# Patient Record
Sex: Female | Born: 1970 | Race: White | Hispanic: No | Marital: Married | Smoking: Former smoker
Health system: Southern US, Community
[De-identification: ages and names within clinical notes are randomized; demographics above are authoritative.]

## PROBLEM LIST (undated history)

## (undated) DIAGNOSIS — D649 Anemia, unspecified: Secondary | ICD-10-CM

## (undated) DIAGNOSIS — F419 Anxiety disorder, unspecified: Secondary | ICD-10-CM

## (undated) DIAGNOSIS — Z8719 Personal history of other diseases of the digestive system: Secondary | ICD-10-CM

## (undated) DIAGNOSIS — I4892 Unspecified atrial flutter: Secondary | ICD-10-CM

## (undated) DIAGNOSIS — I4891 Unspecified atrial fibrillation: Secondary | ICD-10-CM

## (undated) DIAGNOSIS — I10 Essential (primary) hypertension: Secondary | ICD-10-CM

## (undated) DIAGNOSIS — G4733 Obstructive sleep apnea (adult) (pediatric): Secondary | ICD-10-CM

## (undated) DIAGNOSIS — E162 Hypoglycemia, unspecified: Secondary | ICD-10-CM

## (undated) HISTORY — PX: OTHER SURGICAL HISTORY: SHX169

## (undated) HISTORY — PX: APPENDECTOMY: SHX54

## (undated) HISTORY — DX: Unspecified atrial fibrillation: I48.91

## (undated) HISTORY — DX: Essential (primary) hypertension: I10

## (undated) HISTORY — DX: Obstructive sleep apnea (adult) (pediatric): G47.33

## (undated) HISTORY — DX: Anxiety disorder, unspecified: F41.9

---

## 2005-09-10 ENCOUNTER — Inpatient Hospital Stay: Payer: Self-pay | Admitting: Internal Medicine

## 2005-09-10 ENCOUNTER — Other Ambulatory Visit: Payer: Self-pay

## 2005-09-11 ENCOUNTER — Other Ambulatory Visit: Payer: Self-pay

## 2005-10-22 ENCOUNTER — Other Ambulatory Visit: Payer: Self-pay

## 2005-10-22 ENCOUNTER — Inpatient Hospital Stay: Payer: Self-pay | Admitting: Internal Medicine

## 2005-11-14 ENCOUNTER — Other Ambulatory Visit: Payer: Self-pay

## 2005-11-14 ENCOUNTER — Emergency Department: Payer: Self-pay | Admitting: Emergency Medicine

## 2006-05-08 ENCOUNTER — Other Ambulatory Visit: Payer: Self-pay

## 2006-05-08 ENCOUNTER — Inpatient Hospital Stay: Payer: Self-pay | Admitting: *Deleted

## 2006-05-09 ENCOUNTER — Other Ambulatory Visit: Payer: Self-pay

## 2006-05-17 ENCOUNTER — Other Ambulatory Visit: Payer: Self-pay

## 2006-05-17 ENCOUNTER — Emergency Department: Payer: Self-pay | Admitting: Emergency Medicine

## 2006-08-25 ENCOUNTER — Emergency Department: Payer: Self-pay | Admitting: Emergency Medicine

## 2006-08-25 ENCOUNTER — Other Ambulatory Visit: Payer: Self-pay

## 2006-10-18 ENCOUNTER — Emergency Department: Payer: Self-pay | Admitting: Emergency Medicine

## 2006-10-18 ENCOUNTER — Other Ambulatory Visit: Payer: Self-pay

## 2006-10-29 ENCOUNTER — Emergency Department: Payer: Self-pay | Admitting: Emergency Medicine

## 2006-10-29 ENCOUNTER — Other Ambulatory Visit: Payer: Self-pay

## 2006-11-25 ENCOUNTER — Emergency Department: Payer: Self-pay | Admitting: Emergency Medicine

## 2006-11-25 ENCOUNTER — Other Ambulatory Visit: Payer: Self-pay

## 2008-01-11 ENCOUNTER — Emergency Department: Payer: Self-pay | Admitting: Emergency Medicine

## 2008-04-24 ENCOUNTER — Emergency Department: Payer: Self-pay | Admitting: Internal Medicine

## 2008-11-17 ENCOUNTER — Emergency Department: Payer: Self-pay | Admitting: Emergency Medicine

## 2008-11-28 ENCOUNTER — Emergency Department: Payer: Self-pay | Admitting: Emergency Medicine

## 2009-05-16 ENCOUNTER — Emergency Department: Payer: Self-pay | Admitting: Unknown Physician Specialty

## 2010-01-22 ENCOUNTER — Emergency Department: Payer: Self-pay | Admitting: Emergency Medicine

## 2010-05-06 ENCOUNTER — Emergency Department: Payer: Self-pay | Admitting: Emergency Medicine

## 2011-07-04 ENCOUNTER — Emergency Department: Payer: Self-pay | Admitting: Emergency Medicine

## 2011-07-05 ENCOUNTER — Emergency Department: Payer: Self-pay | Admitting: Emergency Medicine

## 2011-07-05 LAB — BASIC METABOLIC PANEL
BUN: 9 mg/dL (ref 7–18)
Calcium, Total: 8.6 mg/dL (ref 8.5–10.1)
Chloride: 104 mmol/L (ref 98–107)
EGFR (African American): >60
EGFR (Non-African Amer.): >60
Glucose: 115 mg/dL — ABNORMAL HIGH (ref 65–99)
Osmolality: 275 (ref 275–301)
Potassium: 3.9 mmol/L (ref 3.5–5.1)

## 2011-07-05 LAB — CBC
HGB: 10.9 g/dL — ABNORMAL LOW (ref 12.0–16.0)
MCH: 24.4 pg — ABNORMAL LOW (ref 26.0–34.0)
MCHC: 30.8 g/dL — ABNORMAL LOW (ref 32.0–36.0)
WBC: 11.1 10*3/uL — ABNORMAL HIGH (ref 3.6–11.0)

## 2011-10-17 ENCOUNTER — Emergency Department: Payer: Self-pay | Admitting: Emergency Medicine

## 2012-01-27 ENCOUNTER — Emergency Department: Payer: Self-pay | Admitting: Emergency Medicine

## 2012-01-27 LAB — RAPID INFLUENZA A&B ANTIGENS

## 2012-07-24 ENCOUNTER — Emergency Department: Payer: Self-pay | Admitting: Emergency Medicine

## 2012-07-26 LAB — BETA STREP CULTURE(ARMC)

## 2013-12-20 ENCOUNTER — Emergency Department: Payer: Self-pay | Admitting: Emergency Medicine

## 2013-12-20 LAB — BASIC METABOLIC PANEL
ANION GAP: 6 — AB (ref 7–16)
BUN: 8 mg/dL (ref 7–18)
Calcium, Total: 8.4 mg/dL — ABNORMAL LOW (ref 8.5–10.1)
Chloride: 109 mmol/L — ABNORMAL HIGH (ref 98–107)
Co2: 26 mmol/L (ref 21–32)
Creatinine: 0.63 mg/dL (ref 0.60–1.30)
EGFR (Non-African Amer.): >60
GLUCOSE: 106 mg/dL — AB (ref 65–99)
Osmolality: 280 (ref 275–301)
Potassium: 3.8 mmol/L (ref 3.5–5.1)
SODIUM: 141 mmol/L (ref 136–145)

## 2013-12-20 LAB — CBC
HCT: 38.1 % (ref 35.0–47.0)
HGB: 12.1 g/dL (ref 12.0–16.0)
MCH: 25.4 pg — AB (ref 26.0–34.0)
MCHC: 31.8 g/dL — ABNORMAL LOW (ref 32.0–36.0)
MCV: 80 fL (ref 80–100)
PLATELETS: 374 10*3/uL (ref 150–440)
RBC: 4.78 10*6/uL (ref 3.80–5.20)
RDW: 15.9 % — ABNORMAL HIGH (ref 11.5–14.5)
WBC: 6.5 10*3/uL (ref 3.6–11.0)

## 2013-12-20 LAB — PRO B NATRIURETIC PEPTIDE: B-Type Natriuretic Peptide: 44 pg/mL (ref 0–125)

## 2013-12-20 LAB — TROPONIN I: Troponin-I: <0.02 ng/mL

## 2014-05-02 ENCOUNTER — Emergency Department
Admit: 2014-05-02 | Disposition: A | Discharge status: Home / Self Care | Payer: Self-pay | Admitting: Emergency Medicine

## 2014-05-02 LAB — CBC
HCT: 39.4 % (ref 35.0–47.0)
HGB: 13 g/dL (ref 12.0–16.0)
MCH: 26.3 pg (ref 26.0–34.0)
MCHC: 33 g/dL (ref 32.0–36.0)
MCV: 80 fL (ref 80–100)
PLATELETS: 345 10*3/uL (ref 150–440)
RBC: 4.94 10*6/uL (ref 3.80–5.20)
RDW: 16.2 % — ABNORMAL HIGH (ref 11.5–14.5)
WBC: 9.6 10*3/uL (ref 3.6–11.0)

## 2014-05-02 LAB — URINALYSIS, COMPLETE
BILIRUBIN, UR: NEGATIVE
Blood: NEGATIVE
Glucose,UR: NEGATIVE mg/dL (ref 0–75)
Hyaline Cast: 2
Leukocyte Esterase: NEGATIVE
Nitrite: NEGATIVE
PH: 5 (ref 4.5–8.0)
Protein: NEGATIVE
RBC,UR: NONE SEEN /HPF (ref 0–5)
Specific Gravity: 1.024 (ref 1.003–1.030)
Squamous Epithelial: 5

## 2014-05-02 LAB — COMPREHENSIVE METABOLIC PANEL
ALK PHOS: 67 U/L
ANION GAP: 7 (ref 7–16)
Albumin: 4.6 g/dL
BUN: 10 mg/dL
Bilirubin,Total: 0.8 mg/dL
CALCIUM: 9.3 mg/dL
CHLORIDE: 107 mmol/L
CREATININE: 0.57 mg/dL
Co2: 24 mmol/L
EGFR (African American): >60
EGFR (Non-African Amer.): >60
GLUCOSE: 110 mg/dL — AB
POTASSIUM: 3.8 mmol/L
SGOT(AST): 18 U/L
SGPT (ALT): 19 U/L
Sodium: 138 mmol/L
Total Protein: 8.2 g/dL — ABNORMAL HIGH

## 2014-05-02 LAB — LIPASE, BLOOD: Lipase: 54 U/L — ABNORMAL HIGH

## 2014-06-12 ENCOUNTER — Emergency Department
Admission: EM | Admit: 2014-06-12 | Discharge: 2014-06-12 | Disposition: A | Discharge status: Home / Self Care | Payer: Self-pay | Attending: Student | Admitting: Student

## 2014-06-12 ENCOUNTER — Encounter: Payer: Self-pay | Admitting: Emergency Medicine

## 2014-06-12 DIAGNOSIS — Z720 Tobacco use: Secondary | ICD-10-CM | POA: Insufficient documentation

## 2014-06-12 DIAGNOSIS — J0100 Acute maxillary sinusitis, unspecified: Secondary | ICD-10-CM | POA: Insufficient documentation

## 2014-06-12 MED ORDER — AMOXICILLIN-POT CLAVULANATE 875-125 MG PO TABS: 1.0000 | ORAL_TABLET | Freq: Two times a day (BID) | ORAL | Status: AC

## 2014-06-12 NOTE — ED Notes (Signed)
States cough is productive   Greenish/yellow phlegm

## 2014-06-12 NOTE — ED Notes (Signed)
Sore throat about 2-3 days ago  Now having body aches cpngestion  cough

## 2014-06-12 NOTE — ED Notes (Signed)
C/o sorethroat, cold symptoms, prod cough greenish past few days, back of throat slightly red, lungs clear

## 2014-06-12 NOTE — ED Provider Notes (Signed)
[facility] Emergency Department Provider Note  ____________________________________________  Time seen: Approximately 11:30 AM  I have reviewed the triage vital signs and the nursing notes.   HISTORY  Chief Complaint Influenza   HPI Hassie BruceJoanne Carrol is a 44 y.o. female presents to the ER with complaints of congestion, green nasal discharge, sinus pressure, sore throat and cough. Patient states symptoms have been present times approximately 6 days. It worsened over 2-3 days. Denies chest pain, palpitations, or shortness of breath. Intermittent chills unknown if fever. Reports continues to eat and drink well. Denies nausea, vomiting, abdominal pain.   Medical History: Diverticulitis Atrial fibrillation with cardiac ablation Anemia Hypoglycemia Left ovarian cyst  There are no active problems to display for this patient.   History reviewed. No pertinent past surgical history.  No current outpatient prescriptions on file.  Allergies Barbiturates; Codeine; and Ibuprofen  No family history on file.  Social History History  Substance Use Topics  . Smoking status: Current Every Day Smoker  . Smokeless tobacco: Not on file  . Alcohol Use: No    Review of Systems Constitutional: Positive for chills Eyes: No visual changes. ENT: Congestion sore throat as above Cardiovascular: Denies chest pain. Respiratory: Cough as above. Denies shortness of breath. Gastrointestinal: No abdominal pain.  No nausea, no vomiting.  No diarrhea.  No constipation. Genitourinary: Negative for dysuria. Musculoskeletal: Negative for back pain. Skin: Negative for rash. Neurological: Negative for headaches, focal weakness or numbness.  10-point ROS otherwise negative.  ____________________________________________   PHYSICAL EXAM:  VITAL SIGNS: ED Triage Vitals  Enc Vitals Group     BP 06/12/14 0919 125/96 mmHg     Pulse Rate 06/12/14 0919 87     Resp 06/12/14 0919 20      Temp 06/12/14 0919 98.4 F (36.9 C)     Temp Source 06/12/14 0919 Oral     SpO2 06/12/14 0919 100 %     Weight 06/12/14 0919 203 lb (92.08 kg)     Height 06/12/14 0919 5\' 9"  (1.753 m)     Head Cir --      Peak Flow --      Pain Score 06/12/14 0922 5     Pain Loc --      Pain Edu? --      Excl. in GC? --     Constitutional: Alert and oriented. Well appearing and in no acute distress. Eyes: Conjunctivae are normal. PERRL. EOMI. Head: Atraumatic. Mild frontal sinus tenderness to palpation. Moderate maxillary sinus tenderness to palpation. No swelling, erythema, or induration. Nose: Purulent green nasal congestion with nasal mucosal inflammation. Nares patent. Mouth/Throat: Mucous membranes are moist.  Mild pharyngeal erythema. No tonsillar swelling, exudate. Neck: No stridor. No cervical spine tenderness to palpation. Hematological/Lymphatic/Immunilogical: No cervical lymphadenopathy. Cardiovascular: Normal rate, regular rhythm. Grossly normal heart sounds.  Good peripheral circulation. Respiratory: Normal respiratory effort.  No retractions. Lungs CTAB. Gastrointestinal: Soft and nontender. No distention. No abdominal bruits. No CVA tenderness. Musculoskeletal: No lower extremity tenderness nor edema.  No joint effusions. Neurologic:  Normal speech and language. No gross focal neurologic deficits are appreciated. Speech is normal. No gait instability. Skin:  Skin is warm, dry and intact. No rash noted. Psychiatric: Mood and affect are normal. Speech and behavior are normal.  ____________________________________________ _________________________________________   INITIAL IMPRESSION / ASSESSMENT AND PLAN / ED COURSE  Pertinent labs & imaging results that were available during my care of the patient were reviewed by me and considered in my  medical decision making (see chart for details).  Well-appearing. No acute distress. Continues to tolerate by mouth fluids at home. Presents  to the ER with complaints of sinus pain and congestion.Lungs clear throughout. Suspect bacterial sinusitis. Will treat with oral Augmentin. Tylenol or ibuprofen over-the-counter as needed. discussed the importance of supportive treatment including fluids and rest. Follow with primary care physician in the next 2-3 days. Discussed follow-up and return parameters. patient return to ER for any worsening concerns. Patient agreed to plan. ____________________________________________   FINAL CLINICAL IMPRESSION(S) / ED DIAGNOSES  Final diagnoses:  Acute maxillary sinusitis, recurrence not specified      Renford DillsLindsey Yoseline Andersson, NP 06/12/14 1204  Gayla DossEryka A Gayle, MD 06/12/14 1455

## 2014-06-12 NOTE — Discharge Instructions (Signed)
Take medications as described. Rest. Drink plenty of water. Take over the counter Tylenol or Ibuprofen As Needed. Follow-Up Primary Care Physician in the Next 2-3 Days. Or Follow up with the above As Needed. Return to the ER for new or worsening concerns.  Sinusitis Sinusitis is redness, soreness, and puffiness (inflammation) of the air pockets in the bones of your face (sinuses). The redness, soreness, and puffiness can cause air and mucus to get trapped in your sinuses. This can allow germs to grow and cause an infection.  HOME CARE   Drink enough fluids to keep your pee (urine) clear or pale yellow.  Use a humidifier in your home.  Run a hot shower to create steam in the bathroom. Sit in the bathroom with the door closed. Breathe in the steam 3-4 times a day.  Put a warm, moist washcloth on your face 3-4 times a day, or as told by your doctor.  Use salt water sprays (saline sprays) to wet the thick fluid in your nose. This can help the sinuses drain.  Only take medicine as told by your doctor. GET HELP RIGHT AWAY IF:   Your pain gets worse.  You have very bad headaches.  You are sick to your stomach (nauseous).  You throw up (vomit).  You are very sleepy (drowsy) all the time.  Your face is puffy (swollen).  Your vision changes.  You have a stiff neck.  You have trouble breathing. MAKE SURE YOU:   Understand these instructions.  Will watch your condition.  Will get help right away if you are not doing well or get worse. Document Released: 07/06/2007 Document Revised: 10/12/2011 Document Reviewed: 08/23/2011 [facility] Patient Information 2015 [place]. This information is not intended to replace advice given to you by your health care provider. Make sure you discuss any questions you have with your health care provider.

## 2014-06-28 ENCOUNTER — Emergency Department
Admission: EM | Admit: 2014-06-28 | Discharge: 2014-06-29 | Disposition: A | Discharge status: Home / Self Care | Payer: Self-pay | Attending: Emergency Medicine | Admitting: Emergency Medicine

## 2014-06-28 ENCOUNTER — Emergency Department: Payer: Self-pay

## 2014-06-28 ENCOUNTER — Encounter: Payer: Self-pay | Admitting: Emergency Medicine

## 2014-06-28 DIAGNOSIS — N920 Excessive and frequent menstruation with regular cycle: Secondary | ICD-10-CM | POA: Insufficient documentation

## 2014-06-28 DIAGNOSIS — Z87891 Personal history of nicotine dependence: Secondary | ICD-10-CM | POA: Insufficient documentation

## 2014-06-28 DIAGNOSIS — Z3202 Encounter for pregnancy test, result negative: Secondary | ICD-10-CM | POA: Insufficient documentation

## 2014-06-28 DIAGNOSIS — Z79899 Other long term (current) drug therapy: Secondary | ICD-10-CM | POA: Insufficient documentation

## 2014-06-28 DIAGNOSIS — R42 Dizziness and giddiness: Secondary | ICD-10-CM | POA: Insufficient documentation

## 2014-06-28 HISTORY — DX: Personal history of other diseases of the digestive system: Z87.19

## 2014-06-28 HISTORY — DX: Anemia, unspecified: D64.9

## 2014-06-28 HISTORY — DX: Unspecified atrial flutter: I48.92

## 2014-06-28 HISTORY — DX: Hypoglycemia, unspecified: E16.2

## 2014-06-28 LAB — URINALYSIS COMPLETE WITH MICROSCOPIC (ARMC ONLY)
BILIRUBIN URINE: NEGATIVE
Bacteria, UA: NONE SEEN
GLUCOSE, UA: NEGATIVE mg/dL
Ketones, ur: NEGATIVE mg/dL
Nitrite: NEGATIVE
PROTEIN: NEGATIVE mg/dL
Specific Gravity, Urine: 1.006 (ref 1.005–1.030)
pH: 9 — ABNORMAL HIGH (ref 5.0–8.0)

## 2014-06-28 LAB — HCG, QUANTITATIVE, PREGNANCY

## 2014-06-28 LAB — CBC
HCT: 37.9 % (ref 35.0–47.0)
Hemoglobin: 12.6 g/dL (ref 12.0–16.0)
MCH: 27.1 pg (ref 26.0–34.0)
MCHC: 33.4 g/dL (ref 32.0–36.0)
MCV: 81.2 fL (ref 80.0–100.0)
PLATELETS: 334 10*3/uL (ref 150–440)
RBC: 4.66 MIL/uL (ref 3.80–5.20)
RDW: 16 % — ABNORMAL HIGH (ref 11.5–14.5)
WBC: 7.1 10*3/uL (ref 3.6–11.0)

## 2014-06-28 LAB — POCT PREGNANCY, URINE: PREG TEST UR: NEGATIVE

## 2014-06-28 MED ORDER — SODIUM CHLORIDE 0.9 % IV BOLUS (SEPSIS)
1000.0000 mL | INTRAVENOUS | Status: AC
  Administered 2014-06-28: 1000 mL via INTRAVENOUS

## 2014-06-28 NOTE — Discharge Instructions (Signed)
As we discussed, your workup was reassuring today, with an essentially normal physical exam, a normal blood count, and reassuring ultrasound.  Please follow-up with your doctor as planned, or call Dr. Tiburcio PeaHarris or one of his partners at the number included to follow-up with a local OB/GYN doctor.  If your symptoms get worse, or if you develop new symptoms that concern you, please return immediately to the emergency department   Menorrhagia Menorrhagia is the medical term for when your menstrual periods are heavy or last longer than usual. With menorrhagia, every period you have may cause enough blood loss and cramping that you are unable to maintain your usual activities. CAUSES  In some cases, the cause of heavy periods is unknown, but a number of conditions may cause menorrhagia. Common causes include:  A problem with the hormone-producing thyroid gland (hypothyroid).  Noncancerous growths in the uterus (polyps or fibroids).  An imbalance of the estrogen and progesterone hormones.  One of your ovaries not releasing an egg during one or more months.  Side effects of having an intrauterine device (IUD).  Side effects of some medicines, such as anti-inflammatory medicines or blood thinners.  A bleeding disorder that stops your blood from clotting normally. SIGNS AND SYMPTOMS  During a normal period, bleeding lasts between 4 and 8 days. Signs that your periods are too heavy include:  You routinely have to change your pad or tampon every 1 or 2 hours because it is completely soaked.  You pass blood clots larger than 1 inch (2.5 cm) in size.  You have bleeding for more than 7 days.  You need to use pads and tampons at the same time because of heavy bleeding.  You need to wake up to change your pads or tampons during the night.  You have symptoms of anemia, such as tiredness, fatigue, or shortness of breath. DIAGNOSIS  Your health care provider will perform a physical exam and ask you  questions about your symptoms and menstrual history. Other tests may be ordered based on what the health care provider finds during the exam. These tests can include:  Blood tests. Blood tests are used to check if you are pregnant or have hormonal changes, a bleeding or thyroid disorder, low iron levels (anemia), or other problems.  Endometrial biopsy. Your health care provider takes a sample of tissue from the inside of your uterus to be examined under a microscope.  Pelvic ultrasound. This test uses sound waves to make a picture of your uterus, ovaries, and vagina. The pictures can show if you have fibroids or other growths.  Hysteroscopy. For this test, your health care provider will use a small telescope to look inside your uterus. Based on the results of your initial tests, your health care provider may recommend further testing. TREATMENT  Treatment may not be needed. If it is needed, your health care provider may recommend treatment with one or more medicines first. If these do not reduce bleeding enough, a surgical treatment might be an option. The best treatment for you will depend on:   Whether you need to prevent pregnancy.  Your desire to have children in the future.  The cause and severity of your bleeding.  Your opinion and personal preference.  Medicines for menorrhagia may include:  Birth control methods that use hormones. These include the pill, skin patch, vaginal ring, shots that you get every 3 months, hormonal IUD, and implant. These treatments reduce bleeding during your menstrual period.  Medicines that thicken  blood and slow bleeding.  Medicines that reduce swelling, such as ibuprofen.  Medicines that contain a synthetic hormone called progestin.   Medicines that make the ovaries stop working for a short time.  You may need surgical treatment for menorrhagia if the medicines are unsuccessful. Treatment options include:  Dilation and curettage (D&C). In  this procedure, your health care provider opens (dilates) your cervix and then scrapes or suctions tissue from the lining of your uterus to reduce menstrual bleeding.  Operative hysteroscopy. This procedure uses a tiny tube with a light (hysteroscope) to view your uterine cavity and can help in the surgical removal of a polyp that may be causing heavy periods.  Endometrial ablation. Through various techniques, your health care provider permanently destroys the entire lining of your uterus (endometrium). After endometrial ablation, most women have little or no menstrual flow. Endometrial ablation reduces your ability to become pregnant.  Endometrial resection. This surgical procedure uses an electrosurgical wire loop to remove the lining of the uterus. This procedure also reduces your ability to become pregnant.  Hysterectomy. Surgical removal of the uterus and cervix is a permanent procedure that stops menstrual periods. Pregnancy is not possible after a hysterectomy. This procedure requires anesthesia and hospitalization. HOME CARE INSTRUCTIONS   Only take over-the-counter or prescription medicines as directed by your health care provider. Take prescribed medicines exactly as directed. Do not change or switch medicines without consulting your health care provider.  Take any prescribed iron pills exactly as directed by your health care provider. Long-term heavy bleeding may result in low iron levels. Iron pills help replace the iron your body lost from heavy bleeding. Iron may cause constipation. If this becomes a problem, increase the bran, fruits, and roughage in your diet.  Do not take aspirin or medicines that contain aspirin 1 week before or during your menstrual period. Aspirin may make the bleeding worse.  If you need to change your sanitary pad or tampon more than once every 2 hours, stay in bed and rest as much as possible until the bleeding stops.  Eat well-balanced meals. Eat foods high  in iron. Examples are leafy green vegetables, meat, liver, eggs, and whole grain breads and cereals. Do not try to lose weight until the abnormal bleeding has stopped and your blood iron level is back to normal. SEEK MEDICAL CARE IF:   You soak through a pad or tampon every 1 or 2 hours, and this happens every time you have a period.  You need to use pads and tampons at the same time because you are bleeding so much.  You need to change your pad or tampon during the night.  You have a period that lasts for more than 8 days.  You pass clots bigger than 1 inch wide.  You have irregular periods that happen more or less often than once a month.  You feel dizzy or faint.  You feel very weak or tired.  You feel short of breath or feel your heart is beating too fast when you exercise.  You have nausea and vomiting or diarrhea while you are taking your medicine.  You have any problems that may be related to the medicine you are taking. SEEK IMMEDIATE MEDICAL CARE IF:   You soak through 4 or more pads or tampons in 2 hours.  You have any bleeding while you are pregnant. MAKE SURE YOU:   Understand these instructions.  Will watch your condition.  Will get help right away if  you are not doing well or get worse. Document Released: 01/17/2005 Document Revised: 01/22/2013 Document Reviewed: 07/08/2012 [facility] Patient Information 2015 [place], [place]. This information is not intended to replace advice given to you by your health care provider. Make sure you discuss any questions you have with your health care provider.

## 2014-06-28 NOTE — ED Provider Notes (Addendum)
[facility] Emergency Department Provider Note  ____________________________________________  Time seen: Approximately 6:06 PM  I have reviewed the triage vital signs and the nursing notes.   HISTORY  Chief Complaint Vaginal Bleeding    HPI Amanda Holt is a 44 y.o. female with no significant past medical history who presents with vaginal bleeding that she describes as severe.  She started her regular period about 2-3 days ago but states that she is typically very regular with predictable amount of bleeding.  However, just prior to arrival she states that she passed a very large blood clot and had blood "gushing" from her vagina.  She was concerned about the amount of blood lost and was feeling lightheaded and dizzy.  She called EMS and was brought to the emergency department.  She also describes generalized lower abdominal cramping which is been severe at times but which waxes and wanes and is currently mild.  No nausea or vomiting.  She does not believe she is pregnant.  She has not had this problem in the past.  She states that normally she goes through about 4 pads a day during her period, but she has gone through "a lot more" today.   Past Medical History  Diagnosis Date  . Atrial flutter   . Anemia   . Hypoglycemia   . Hx of diverticulitis of colon     There are no active problems to display for this patient.   Past Surgical History  Procedure Laterality Date  . Eblation    . Appendectomy    . Cesarian section      Current Outpatient Rx  Name  Route  Sig  Dispense  Refill  . ECHINACEA PO   Oral   Take 1 capsule by mouth daily as needed (for cold symptoms.).         Marland Kitchen Probiotic Product (PROBIOTIC ACIDOPHILUS) CAPS   Oral   Take 1 capsule by mouth daily.         . vitamin C (ASCORBIC ACID) 500 MG tablet   Oral   Take 500 mg by mouth daily.           Allergies Barbiturates; Codeine; and Ibuprofen  History reviewed. No pertinent  family history.  Social History History  Substance Use Topics  . Smoking status: Former Games developer  . Smokeless tobacco: Not on file  . Alcohol Use: No    Review of Systems Constitutional: No fever/chills Eyes: No visual changes. ENT: No sore throat. Cardiovascular: Denies chest pain. Respiratory: Denies shortness of breath. Gastrointestinal: Cramping lower abdominal pain.  No nausea, no vomiting.  No diarrhea.  No constipation. Genitourinary: Negative for dysuria.  Heavy vaginal bleeding Musculoskeletal: Negative for back pain. Skin: Negative for rash. Neurological: Negative for headaches, focal weakness or numbness.  10-point ROS otherwise negative.  ____________________________________________   PHYSICAL EXAM:  VITAL SIGNS: 125/96, 87, 20, 98.4 degrees, 100% spO2 on RA   Constitutional: Alert and oriented. Anxious but otherwise well appearing and in no acute distress. Eyes: Conjunctivae are normal. PERRL. EOMI. Head: Atraumatic. Nose: No congestion/rhinnorhea. Mouth/Throat: Mucous membranes are moist.  Oropharynx non-erythematous. Neck: No stridor.   Cardiovascular: Normal rate, regular rhythm. Grossly normal heart sounds.  Good peripheral circulation. Respiratory: Normal respiratory effort.  No retractions. Lungs CTAB. Gastrointestinal: Soft with mild generalized tenderness but no guarding or rebound. No distention. No abdominal bruits. No CVA tenderness. Genitourinary: 2 thumb-sized blood clots were present in the vaginal vault which were removed.  The cervical os  is closed with no active bleeding.  She was tender upon the exam but states this is normal for her.  No other abnormalities detected. Musculoskeletal: No lower extremity tenderness nor edema.  No joint effusions. Neurologic:  Normal speech and language. No gross focal neurologic deficits are appreciated. Speech is normal. No gait instability. Skin:  Skin is warm, dry and intact. No rash noted. Psychiatric:  Mood and affect are normal. Speech and behavior are normal.  ____________________________________________   LABS (all labs ordered are listed, but only abnormal results are displayed)  Labs Reviewed  CBC - Abnormal; Notable for the following:    RDW 16.0 (*)    All other components within normal limits  URINALYSIS COMPLETEWITH MICROSCOPIC ([facility] ONLY) - Abnormal; Notable for the following:    Color, Urine STRAW (*)    APPearance CLEAR (*)    Hgb urine dipstick 3+ (*)    pH 9.0 (*)    Leukocytes, UA TRACE (*)    Squamous Epithelial / LPF 0-5 (*)    All other components within normal limits  HCG, QUANTITATIVE, PREGNANCY  POCT PREGNANCY, URINE   ____________________________________________  EKG  Not indicated ____________________________________________  RADIOLOGY  Pending ultrasounds  ____________________________________________   PROCEDURES  Procedure(s) performed: None  Critical Care performed: No ____________________________________________   INITIAL IMPRESSION / ASSESSMENT AND PLAN / ED COURSE  Pertinent labs & imaging results that were available during my care of the patient were reviewed by me and considered in my medical decision making (see chart for details).  Suspect menorrhagia, but we will proceed with typical workup including baseline CBC, , pregnancy test (though patient has had a tubal ligation).  I will give 1 L of normal saline given her symptoms of lightheadedness and dizziness.    ----------------------------------------- 9:43 PM on 06/28/2014 -----------------------------------------   The patient's hemoglobin is normal.  We are awaiting ultrasound results. Dr. Pershing ProudSchaevitz will follow up ultrasounds, but anticipate discharge with outpatient follow up ([facility] artery [facility]   ____________________________________________  FINAL CLINICAL IMPRESSION(S) / ED DIAGNOSES  Final diagnoses:  Menorrhagia       NEW MEDICATIONS STARTED DURING THIS VISIT:  New Prescriptions   No medications on file     Loleta Roseory Rickie Gutierres, MD 06/28/14 2217

## 2014-06-28 NOTE — ED Notes (Signed)
Patient transported to Ultrasound 

## 2014-06-28 NOTE — ED Notes (Signed)
Pt up to commode for urine sample. Pt with significant other at bedside. Pt states "i'm cramping just a little bit, but i'm ok".

## 2014-06-28 NOTE — ED Notes (Signed)
Pt returned from us. Pt up to restroom to void. Scant bright red bleeding noted to pad beneath pt's buttocks. Pad has not been changed since 1900. Pt with pwd skin, vss. Call bell on side of bed.

## 2014-06-29 NOTE — ED Provider Notes (Signed)
This patient was seen by my colleague Dr. York CeriseForbach for vaginal bleeding and menorrhagia. An ultrasound of her pelvis was performed. This ultrasound showed no acute findings. Please see Dr. Scharlene CornForbach's note for additional information. At this hour, 12:15, the patient appears well and stable and ready for discharge. We'll release her home for follow-up with GYN.  Pelvic ultrasound: IMPRESSION: 1. No acute findings. 2. Re- demonstrated 7 cm left ovarian cyst. The cyst may have a small mural nodule, and outpatient pelvic MRI and surgical consultation is again recommended.  Darien Ramusavid W Ermina Oberman, MD 06/29/14 [phone]

## 2014-07-16 ENCOUNTER — Ambulatory Visit: Payer: Self-pay

## 2014-07-21 ENCOUNTER — Encounter: Payer: Self-pay | Admitting: *Deleted

## 2014-07-21 ENCOUNTER — Ambulatory Visit: Payer: Self-pay | Attending: Oncology | Admitting: *Deleted

## 2014-07-21 ENCOUNTER — Ambulatory Visit
Admission: RE | Admit: 2014-07-21 | Discharge: 2014-07-21 | Disposition: A | Discharge status: Home / Self Care | Payer: Self-pay | Source: Ambulatory Visit | Attending: Oncology | Admitting: Oncology

## 2014-07-21 VITALS — BP 121/83 | HR 74 | Temp 96.9°F | Resp 20 | Ht 68.11 in | Wt 197.1 lb

## 2014-07-21 DIAGNOSIS — Z0000 Encounter for general adult medical examination without abnormal findings: Secondary | ICD-10-CM

## 2014-07-21 NOTE — Progress Notes (Signed)
Subjective:     Patient ID: Amanda Holt, female   DOB: 1970-09-15, 44 y.o.   MRN: [id number]  HPI   Review of Systems     Objective:   Physical Exam  Pulmonary/Chest: Right breast exhibits no inverted nipple, no mass, no nipple discharge, no skin change and no tenderness. Left breast exhibits no inverted nipple, no mass, no nipple discharge, no skin change and no tenderness. Breasts are symmetrical.       Assessment:     44 year old White female presents to [facility] for clinical breast exam and mammogram.  Clinical breast exam unremarkable.  Bilateral breast are tender to palpation.  Patient states she is on her period today, but that they are irregular.  States she is going to [facility] for evaluation of an ovarian cyst.  Taught self breast awareness.      Plan:     Screening mammogram ordered.  Follow-up per protocol.

## 2014-07-24 ENCOUNTER — Encounter: Payer: Self-pay | Admitting: *Deleted

## 2014-07-24 NOTE — Progress Notes (Signed)
Letter mailed from [facility] to inform patient of her normal mammogram results.  Patient is to follow-up with annual screening in one year. 

## 2014-08-13 ENCOUNTER — Encounter: Payer: Self-pay | Admitting: Emergency Medicine

## 2014-08-13 ENCOUNTER — Emergency Department
Admission: EM | Admit: 2014-08-13 | Discharge: 2014-08-13 | Disposition: A | Discharge status: Home / Self Care | Payer: Self-pay | Attending: Emergency Medicine | Admitting: Emergency Medicine

## 2014-08-13 ENCOUNTER — Emergency Department: Payer: Self-pay

## 2014-08-13 DIAGNOSIS — Z87891 Personal history of nicotine dependence: Secondary | ICD-10-CM | POA: Insufficient documentation

## 2014-08-13 DIAGNOSIS — R1110 Vomiting, unspecified: Secondary | ICD-10-CM

## 2014-08-13 DIAGNOSIS — K5792 Diverticulitis of intestine, part unspecified, without perforation or abscess without bleeding: Secondary | ICD-10-CM | POA: Insufficient documentation

## 2014-08-13 DIAGNOSIS — Z3202 Encounter for pregnancy test, result negative: Secondary | ICD-10-CM | POA: Insufficient documentation

## 2014-08-13 DIAGNOSIS — Z79899 Other long term (current) drug therapy: Secondary | ICD-10-CM | POA: Insufficient documentation

## 2014-08-13 LAB — URINALYSIS COMPLETE WITH MICROSCOPIC (ARMC ONLY)
BILIRUBIN URINE: NEGATIVE
Glucose, UA: NEGATIVE mg/dL
HGB URINE DIPSTICK: NEGATIVE
Ketones, ur: NEGATIVE mg/dL
Leukocytes, UA: NEGATIVE
NITRITE: NEGATIVE
Protein, ur: NEGATIVE mg/dL
SPECIFIC GRAVITY, URINE: 1.01 (ref 1.005–1.030)
pH: 8 (ref 5.0–8.0)

## 2014-08-13 LAB — CBC WITH DIFFERENTIAL/PLATELET
BASOS PCT: 1 %
Basophils Absolute: 0 10*3/uL (ref 0–0.1)
Eosinophils Absolute: 0 10*3/uL (ref 0–0.7)
Eosinophils Relative: 1 %
HCT: 39.8 % (ref 35.0–47.0)
HEMOGLOBIN: 12.6 g/dL (ref 12.0–16.0)
LYMPHS ABS: 1.3 10*3/uL (ref 1.0–3.6)
LYMPHS PCT: 21 %
MCH: 25.9 pg — AB (ref 26.0–34.0)
MCHC: 31.7 g/dL — AB (ref 32.0–36.0)
MCV: 81.5 fL (ref 80.0–100.0)
Monocytes Absolute: 0.5 10*3/uL (ref 0.2–0.9)
Monocytes Relative: 8 %
NEUTROS PCT: 69 %
Neutro Abs: 4.4 10*3/uL (ref 1.4–6.5)
Platelets: 325 10*3/uL (ref 150–440)
RBC: 4.88 MIL/uL (ref 3.80–5.20)
RDW: 15.4 % — AB (ref 11.5–14.5)
WBC: 6.3 10*3/uL (ref 3.6–11.0)

## 2014-08-13 LAB — LIPASE, BLOOD: LIPASE: 52 U/L — AB (ref 22–51)

## 2014-08-13 LAB — COMPREHENSIVE METABOLIC PANEL
ALT: 22 U/L (ref 14–54)
AST: 20 U/L (ref 15–41)
Albumin: 4.3 g/dL (ref 3.5–5.0)
Alkaline Phosphatase: 67 U/L (ref 38–126)
Anion gap: 7 (ref 5–15)
BUN: 9 mg/dL (ref 6–20)
CHLORIDE: 105 mmol/L (ref 101–111)
CO2: 27 mmol/L (ref 22–32)
Calcium: 9.3 mg/dL (ref 8.9–10.3)
Creatinine, Ser: 0.62 mg/dL (ref 0.44–1.00)
GFR calc non Af Amer: >60 mL/min (ref >60)
GLUCOSE: 102 mg/dL — AB (ref 65–99)
Potassium: 3.8 mmol/L (ref 3.5–5.1)
Sodium: 139 mmol/L (ref 135–145)
Total Bilirubin: 0.7 mg/dL (ref 0.3–1.2)
Total Protein: 7.8 g/dL (ref 6.5–8.1)

## 2014-08-13 LAB — POCT PREGNANCY, URINE: PREG TEST UR: NEGATIVE

## 2014-08-13 MED ORDER — METRONIDAZOLE 500 MG PO TABS
500.0000 mg | ORAL_TABLET | Freq: Once | ORAL | Status: AC
  Administered 2014-08-13: 500 mg via ORAL
  Filled 2014-08-13: qty 1

## 2014-08-13 MED ORDER — IBUPROFEN 800 MG PO TABS
800.0000 mg | ORAL_TABLET | ORAL | Status: AC
  Administered 2014-08-13: 800 mg via ORAL
  Filled 2014-08-13: qty 1

## 2014-08-13 MED ORDER — CIPROFLOXACIN HCL 500 MG PO TABS
500.0000 mg | ORAL_TABLET | Freq: Once | ORAL | Status: AC
  Administered 2014-08-13: 500 mg via ORAL
  Filled 2014-08-13: qty 1

## 2014-08-13 MED ORDER — METRONIDAZOLE 500 MG PO TABS: 500.0000 mg | ORAL_TABLET | Freq: Three times a day (TID) | ORAL | Status: DC

## 2014-08-13 MED ORDER — CIPROFLOXACIN HCL 500 MG PO TABS: 500.0000 mg | ORAL_TABLET | Freq: Two times a day (BID) | ORAL | Status: DC

## 2014-08-13 NOTE — Discharge Instructions (Signed)
Diverticulitis  Please start antibiotic's as prescribed. Follow-up with gastroenterology, and please come back to the [facility] walk-in clinic in 1-2 days for repeat abdominal exam to make sure that your symptoms are improving and that she do not require a CT scan.  Come back to the emergency room right away if there is increased pain, fever, vomiting, you feel dehydrated, noticed blood in your stool, have severe pain in your pelvis, vaginal bleeding, or other new concerns arise.  Diverticulitis is inflammation or infection of small pouches in your colon that form when you have a condition called diverticulosis. The pouches in your colon are called diverticula. Your colon, or large intestine, is where water is absorbed and stool is formed. Complications of diverticulitis can include:  Bleeding.  Severe infection.  Severe pain.  Perforation of your colon.  Obstruction of your colon. CAUSES  Diverticulitis is caused by bacteria. Diverticulitis happens when stool becomes trapped in diverticula. This allows bacteria to grow in the diverticula, which can lead to inflammation and infection. RISK FACTORS People with diverticulosis are at risk for diverticulitis. Eating a diet that does not include enough fiber from fruits and vegetables may make diverticulitis more likely to develop. SYMPTOMS  Symptoms of diverticulitis may include:  Abdominal pain and tenderness. The pain is normally located on the left side of the abdomen, but may occur in other areas.  Fever and chills.  Bloating.  Cramping.  Nausea.  Vomiting.  Constipation.  Diarrhea.  Blood in your stool. DIAGNOSIS  Your health care provider will ask you about your medical history and do a physical exam. You may need to have tests done because many medical conditions can cause the same symptoms as diverticulitis. Tests may include:  Blood tests.  Urine tests.  Imaging tests of the abdomen, including X-rays and CT  scans. When your condition is under control, your health care provider may recommend that you have a colonoscopy. A colonoscopy can show how severe your diverticula are and whether something else is causing your symptoms. TREATMENT  Most cases of diverticulitis are mild and can be treated at home. Treatment may include:  Taking over-the-counter pain medicines.  Following a clear liquid diet.  Taking antibiotic medicines by mouth for 7-10 days. More severe cases may be treated at a hospital. Treatment may include:  Not eating or drinking.  Taking prescription pain medicine.  Receiving antibiotic medicines through an IV tube.  Receiving fluids and nutrition through an IV tube.  Surgery. HOME CARE INSTRUCTIONS   Follow your health care provider's instructions carefully.  Follow a full liquid diet or other diet as directed by your health care provider. After your symptoms improve, your health care provider may tell you to change your diet. He or she may recommend you eat a high-fiber diet. Fruits and vegetables are good sources of fiber. Fiber makes it easier to pass stool.  Take fiber supplements or probiotics as directed by your health care provider.  Only take medicines as directed by your health care provider.  Keep all your follow-up appointments. SEEK MEDICAL CARE IF:   Your pain does not improve.  You have a hard time eating food.  Your bowel movements do not return to normal. SEEK IMMEDIATE MEDICAL CARE IF:   Your pain becomes worse.  Your symptoms do not get better.  Your symptoms suddenly get worse.  You have a fever.  You have repeated vomiting.  You have bloody or black, tarry stools. MAKE SURE YOU:   Understand  these instructions.  Will watch your condition.  Will get help right away if you are not doing well or get worse. Document Released: 10/27/2004 Document Revised: 01/22/2013 Document Reviewed: 12/12/2012 [facility] [employer] Patient Information 2015  [employer] [place]. This information is not intended to replace advice given to you by your health care provider. Make sure you discuss any questions you have with your health care provider.

## 2014-08-13 NOTE — ED Notes (Signed)
Patient transported to X-ray 

## 2014-08-13 NOTE — ED Provider Notes (Signed)
[facility] Emergency Department Provider Note  ____________________________________________  Time seen: Approximately 10:57 AM  I have reviewed the triage vital signs and the nursing notes.   HISTORY  Chief Complaint Abdominal Pain    HPI Amanda Holt is a 44 y.o. female should've diverticulitis that has been recurrent last episode about 3 months ago. She states that yesterday she began to notice aching pain which she describes as "a Holiday representativeconstruction" crampy feeling in the left lower abdomen. Not associated with fever or diarrhea, but states that this is how previous episodes of diverticulitis of started in the past. She's never had any abdominal surgery other than a tubal ligations. Denies any fevers or chills. No nausea or vomiting. She does feels that she has not had any diarrhea, she is still passing gas. Pain is seated in the left lower quadrant and described as a crampy achy feeling. Same with previous bouts of diverticulitis.  She reports last receiving a CAT scan about 3 months ago.     Past Medical History  Diagnosis Date  . Atrial flutter   . Anemia   . Hypoglycemia   . Hx of diverticulitis of colon     There are no active problems to display for this patient.   Past Surgical History  Procedure Laterality Date  . Eblation    . Appendectomy    . Cesarian section      Current Outpatient Rx  Name  Route  Sig  Dispense  Refill  . ibuprofen (ADVIL,MOTRIN) 400 MG tablet   Oral   Take 400 mg by mouth every 6 (six) hours as needed.         . Probiotic Product (PROBIOTIC ACIDOPHILUS) CAPS   Oral   Take 1 capsule by mouth daily.         . vitamin C (ASCORBIC ACID) 500 MG tablet   Oral   Take 500 mg by mouth daily.         . ciprofloxacin (CIPRO) 500 MG tablet   Oral   Take 1 tablet (500 mg total) by mouth 2 (two) times daily.   20 tablet   0   . ECHINACEA PO   Oral   Take 1 capsule by mouth daily as needed (for cold  symptoms.).         Marland Kitchen. metroNIDAZOLE (FLAGYL) 500 MG tablet   Oral   Take 1 tablet (500 mg total) by mouth 3 (three) times daily.   30 tablet   0     Allergies Barbiturates and Codeine  Family History  Problem Relation Age of Onset  . Breast cancer Paternal Aunt 40  . Breast cancer Paternal Grandmother 1450    Social History History  Substance Use Topics  . Smoking status: Former Games developermoker  . Smokeless tobacco: Not on file  . Alcohol Use: No    Review of Systems Constitutional: No fever/chills Eyes: No visual changes. ENT: No sore throat. Cardiovascular: Denies chest pain. Respiratory: Denies shortness of breath. Gastrointestinal: No nausea, no vomiting.  No diarrhea.  No constipation. Genitourinary: Negative for dysuria. Musculoskeletal: Negative for back pain. Skin: Negative for rash. Neurological: Negative for headaches, focal weakness or numbness.  10-point ROS otherwise negative.  ____________________________________________   PHYSICAL EXAM:  VITAL SIGNS: ED Triage Vitals  Enc Vitals Group     BP 08/13/14 0910 130/105 mmHg     Pulse Rate 08/13/14 0910 88     Resp 08/13/14 0910 18     Temp 08/13/14 0910  98.2 F (36.8 C)     Temp Source 08/13/14 0910 Oral     SpO2 08/13/14 0910 97 %     Weight 08/13/14 0910 190 lb (86.183 kg)     Height 08/13/14 0910  (1.727 m)     Head Cir --      Peak Flow --      Pain Score 08/13/14 0911 7     Pain Loc --      Pain Edu? --      Excl. in GC? --     Constitutional: Alert and oriented. Well appearing and in no acute distress. Eyes: Conjunctivae are normal. PERRL. EOMI. Head: Atraumatic. Nose: No congestion/rhinnorhea. Mouth/Throat: Mucous membranes are moist.  Oropharynx non-erythematous. Neck: No stridor.   Cardiovascular: Normal rate, regular rhythm. Grossly normal heart sounds.  Good peripheral circulation. Respiratory: Normal respiratory effort.  No retractions. Lungs CTAB. Gastrointestinal: Soft and  nontender except for some mild focal tenderness in the left lower quadrant without rebound or guarding. No distention. No abdominal bruits. No CVA tenderness. Musculoskeletal: No lower extremity tenderness nor edema.  No joint effusions. Neurologic:  Normal speech and language. No gross focal neurologic deficits are appreciated. No gait instability. Skin:  Skin is warm, dry and intact. No rash noted. Psychiatric: Mood and affect are normal. Speech and behavior are normal.  ____________________________________________   LABS (all labs ordered are listed, but only abnormal results are displayed)  Labs Reviewed  CBC WITH DIFFERENTIAL/PLATELET - Abnormal; Notable for the following:    MCH 25.9 (*)    MCHC 31.7 (*)    RDW 15.4 (*)    All other components within normal limits  COMPREHENSIVE METABOLIC PANEL - Abnormal; Notable for the following:    Glucose, Bld 102 (*)    All other components within normal limits  LIPASE, BLOOD - Abnormal; Notable for the following:    Lipase 52 (*)    All other components within normal limits  URINALYSIS COMPLETEWITH MICROSCOPIC ([facility] ONLY) - Abnormal; Notable for the following:    Color, Urine YELLOW (*)    APPearance CLEAR (*)    Bacteria, UA RARE (*)    Squamous Epithelial / LPF 0-5 (*)    All other components within normal limits  POC URINE PREG, ED  POCT PREGNANCY, URINE   ____________________________________________  EKG   ____________________________________________  RADIOLOGY  DG Abd 2 Views (Final result) Result time: 08/13/14 11:38:15   Final result by Rad Results In Interface (08/13/14 11:38:15)   Narrative:   CLINICAL DATA: Diverticulitis. Abdominal pain. Constipation.  EXAM: ABDOMEN - 2 VIEW  COMPARISON: 05/02/2014  FINDINGS: Amount of stool in the colon within normal limits. No free intraperitoneal gas beneath the hemidiaphragms. No dilated bowel. No abnormal air-fluid levels.  No significant abnormal  calcifications identified.  IMPRESSION: 1. Unremarkable bowel gas pattern.    ____________________________________________   PROCEDURES  Procedure(s) performed: None  Critical Care performed: No  ____________________________________________   INITIAL IMPRESSION / ASSESSMENT AND PLAN / ED COURSE  Pertinent labs & imaging results that were available during my care of the patient were reviewed by me and considered in my medical decision making (see chart for details).  Crampy left lower quadrant pain for about the last day. This is same as previous presentations with diverticulitis per the patient. She does not report any sharp or sudden onset pain, she has no associated genitourinary symptoms, no vaginal bleeding, no discharge, she denies pregnancy. Based on her symptomatology is possible that she  may have early diverticulitis, I do not see any signs of obvious complication. I discussed with the patient repeat CT imaging to further evaluate for the cause for pain including diverticulitis or complications such as abscess or severe infection, but after shared medical decision making in reviewing of the risks of repeat CT imaging and radiation risk we have decided to obtain x-rays imaging to evaluate for any evidence of complication, obstruction and I think this is very reasonable.  We'll obtain basic labs, treat her pain with ibuprofen and she is requested she does not wish for narcotics, and anticipate discharge to follow-up with GI on an antibiotic should her x-ray and labs be reassuring.  I did have a very in-depth discussion about return precautions and certainly should she develop a fever, severe pain, vomiting, blood in her stool, or other new concerns arise she will come back to the emergency room for reevaluation and CT imaging.  ----------------------------------------- 12:09 PM on 08/13/2014 -----------------------------------------   x-rays and labs are reassuring. She reports  improvement with ibuprofen. This point, I feel she is most likely suffering from a slight bout of early diverticulitis given her history and presentation. We'll treat her with antibiotic's and have advised her on close follow-up care and return precautions. She is very agreeable. ____________________________________________   FINAL CLINICAL IMPRESSION(S) / ED DIAGNOSES  Final diagnoses:  Vomiting  Diverticulitis of intestine without perforation or abscess without bleeding      Sharyn Creamer, MD 08/13/14 1212

## 2014-08-13 NOTE — ED Notes (Signed)
Pt present with possible diverticulitis attack started two days. Pt denies eating anything with seeds. Pt with  Nausea.

## 2015-11-04 ENCOUNTER — Encounter: Payer: Self-pay | Admitting: *Deleted

## 2015-11-04 ENCOUNTER — Ambulatory Visit
Admission: RE | Admit: 2015-11-04 | Discharge: 2015-11-04 | Disposition: A | Discharge status: Home / Self Care | Payer: Self-pay | Source: Ambulatory Visit | Attending: Oncology | Admitting: Oncology

## 2015-11-04 ENCOUNTER — Ambulatory Visit: Payer: Self-pay | Attending: Oncology | Admitting: *Deleted

## 2015-11-04 VITALS — BP 138/87 | HR 75 | Temp 98.2°F | Ht 68.11 in | Wt 195.4 lb

## 2015-11-04 DIAGNOSIS — N644 Mastodynia: Secondary | ICD-10-CM

## 2015-11-04 NOTE — Progress Notes (Signed)
Subjective:     Patient ID: Amanda Holt, female   DOB: March 08, 1970, 45 y.o.   MRN: [id number]  HPI   Review of Systems     Objective:   Physical Exam  Pulmonary/Chest: Right breast exhibits tenderness. Right breast exhibits no inverted nipple, no mass, no nipple discharge and no skin change. Left breast exhibits tenderness. Left breast exhibits no inverted nipple, no mass, no nipple discharge and no skin change. Breasts are symmetrical.  Bilateral breast tenderness with greater tenderness in the left breast -        Assessment:     45 [facility] White [facility] about 6 weeks.  States the pain radiates from her chest to her left nipple.  States she went to an ED, and the doc told her it could be costochondritis.  On clinical breast exam there is much greater tenderness noted on left breast.  I can palpate the ribs along the sternum at the left medial breast, which is not palpable on the right.  The left nipple is flat.  The patient is not sure if this is a normal finding for her.  Taught self breast awareness. Patient has been screened for eligibility.  She does not have any insurance, Medicare or Medicaid.  She also meets financial eligibility.  Hand-out given on the Affordable Care Act.    Plan:     Will get bilateral diagnostic mammogram with ultrasound.  If no findings on imaging I will have her return in 2-3 months for reassessment of her breast pain.  She is to try OTC Ibuprofen for pain.   Will follow up per [facility] protocol.

## 2015-11-04 NOTE — Patient Instructions (Signed)
Gave patient hand-out, Women Staying Healthy, Active and Well from [facility], with education on breast health, pap smears, heart and colon health. 

## 2015-12-03 ENCOUNTER — Telehealth: Payer: Self-pay | Admitting: *Deleted

## 2015-12-03 NOTE — Telephone Encounter (Signed)
Called and talked with patient today to follow-up on her breast pain.  State the pain has completely resolved.  Encouraged her to follow-up in one year with annual screening.  HSIS to [place].

## 2015-12-22 ENCOUNTER — Encounter: Payer: Self-pay | Admitting: *Deleted

## 2015-12-22 ENCOUNTER — Emergency Department
Admission: EM | Admit: 2015-12-22 | Discharge: 2015-12-22 | Disposition: A | Discharge status: Home / Self Care | Payer: Self-pay | Attending: Emergency Medicine | Admitting: Emergency Medicine

## 2015-12-22 DIAGNOSIS — Z87891 Personal history of nicotine dependence: Secondary | ICD-10-CM | POA: Insufficient documentation

## 2015-12-22 DIAGNOSIS — R1030 Lower abdominal pain, unspecified: Secondary | ICD-10-CM | POA: Insufficient documentation

## 2015-12-22 DIAGNOSIS — Z5321 Procedure and treatment not carried out due to patient leaving prior to being seen by health care provider: Secondary | ICD-10-CM | POA: Insufficient documentation

## 2015-12-22 LAB — URINALYSIS COMPLETE WITH MICROSCOPIC (ARMC ONLY)
BILIRUBIN URINE: NEGATIVE
Glucose, UA: NEGATIVE mg/dL
KETONES UR: NEGATIVE mg/dL
NITRITE: NEGATIVE
PH: 7 (ref 5.0–8.0)
Protein, ur: NEGATIVE mg/dL
SPECIFIC GRAVITY, URINE: 1.009 (ref 1.005–1.030)

## 2015-12-22 LAB — COMPREHENSIVE METABOLIC PANEL
ALK PHOS: 58 U/L (ref 38–126)
ALT: 15 U/L (ref 14–54)
ANION GAP: 10 (ref 5–15)
AST: 17 U/L (ref 15–41)
Albumin: 4.4 g/dL (ref 3.5–5.0)
BILIRUBIN TOTAL: 0.4 mg/dL (ref 0.3–1.2)
BUN: 10 mg/dL (ref 6–20)
CALCIUM: 9.2 mg/dL (ref 8.9–10.3)
CO2: 23 mmol/L (ref 22–32)
Chloride: 102 mmol/L (ref 101–111)
Creatinine, Ser: 0.55 mg/dL (ref 0.44–1.00)
GFR calc non Af Amer: >60 mL/min (ref >60)
Glucose, Bld: 117 mg/dL — ABNORMAL HIGH (ref 65–99)
POTASSIUM: 3.3 mmol/L — AB (ref 3.5–5.1)
SODIUM: 135 mmol/L (ref 135–145)
TOTAL PROTEIN: 8 g/dL (ref 6.5–8.1)

## 2015-12-22 LAB — CBC
HEMATOCRIT: 38.1 % (ref 35.0–47.0)
HEMOGLOBIN: 12.5 g/dL (ref 12.0–16.0)
MCH: 26.4 pg (ref 26.0–34.0)
MCHC: 32.7 g/dL (ref 32.0–36.0)
MCV: 80.7 fL (ref 80.0–100.0)
Platelets: 335 10*3/uL (ref 150–440)
RBC: 4.73 MIL/uL (ref 3.80–5.20)
RDW: 15.7 % — ABNORMAL HIGH (ref 11.5–14.5)
WBC: 10.5 10*3/uL (ref 3.6–11.0)

## 2015-12-22 LAB — POCT PREGNANCY, URINE: Preg Test, Ur: NEGATIVE

## 2015-12-22 LAB — LIPASE, BLOOD: Lipase: 45 U/L (ref 11–51)

## 2015-12-22 NOTE — ED Notes (Signed)
Pt unable to void at this time. 

## 2015-12-22 NOTE — ED Notes (Signed)
Patient unable to void at this time. Patient given urine cup when able to collect specimen

## 2015-12-22 NOTE — ED Triage Notes (Signed)
Pt to triage via wheelchair.  Pt has low abd pain,  Menses began yesterday.  Pt also reports nausea.  No dysuria.  Pt alert.

## 2015-12-23 ENCOUNTER — Telehealth: Payer: Self-pay | Admitting: Emergency Medicine

## 2015-12-23 NOTE — Telephone Encounter (Signed)
Called patient due to lwot to inquire about condition and follow up plans. She says she is feeling a little better.  Has sent a note to pcp via mychart already.  I explained that labs are complete so pcp can see them.

## 2016-08-09 ENCOUNTER — Emergency Department: Payer: BLUE CROSS/BLUE SHIELD

## 2016-08-09 ENCOUNTER — Emergency Department
Admission: EM | Admit: 2016-08-09 | Discharge: 2016-08-09 | Disposition: A | Discharge status: Home / Self Care | Payer: BLUE CROSS/BLUE SHIELD | Attending: Student in an Organized Health Care Education/Training Program | Admitting: Student in an Organized Health Care Education/Training Program

## 2016-08-09 ENCOUNTER — Encounter: Payer: Self-pay | Admitting: *Deleted

## 2016-08-09 DIAGNOSIS — Y9301 Activity, walking, marching and hiking: Secondary | ICD-10-CM | POA: Diagnosis not present

## 2016-08-09 DIAGNOSIS — Y92007 Garden or yard of unspecified non-institutional (private) residence as the place of occurrence of the external cause: Secondary | ICD-10-CM | POA: Insufficient documentation

## 2016-08-09 DIAGNOSIS — Z87891 Personal history of nicotine dependence: Secondary | ICD-10-CM | POA: Diagnosis not present

## 2016-08-09 DIAGNOSIS — W1842XA Slipping, tripping and stumbling without falling due to stepping into hole or opening, initial encounter: Secondary | ICD-10-CM | POA: Diagnosis not present

## 2016-08-09 DIAGNOSIS — Z79899 Other long term (current) drug therapy: Secondary | ICD-10-CM | POA: Diagnosis not present

## 2016-08-09 DIAGNOSIS — Y999 Unspecified external cause status: Secondary | ICD-10-CM | POA: Diagnosis not present

## 2016-08-09 DIAGNOSIS — S9031XA Contusion of right foot, initial encounter: Secondary | ICD-10-CM | POA: Diagnosis not present

## 2016-08-09 DIAGNOSIS — S99921A Unspecified injury of right foot, initial encounter: Secondary | ICD-10-CM | POA: Diagnosis present

## 2016-08-09 MED ORDER — MELOXICAM 7.5 MG PO TABS: 7.5000 mg | ORAL_TABLET | Freq: Every day | ORAL | 1 refills | Status: AC

## 2016-08-09 NOTE — ED Provider Notes (Signed)
[facility] Emergency Department Provider Note  ____________________________________________  Time seen: Approximately 6:13 PM  I have reviewed the triage vital signs and the nursing notes.   HISTORY  Chief Complaint Foot Pain    HPI Amanda Holt is a 46 y.o. female presenting to the emergency department with right foot pain after patient accidentally stepped into a mole hole near her home. Patient sustained an inversion type injury. Patient states that pain is restricted to the right great toe. Patient states that she has also had some tingling of the right lesser toes. Patient denies prior traumas or surgeries to the right foot. No alleviating measures hae been attempted. Patient has been ambulating at baseline.    Past Medical History:  Diagnosis Date  . Anemia   . Atrial flutter (HCC)   . Hx of diverticulitis of colon   . Hypoglycemia     There are no active problems to display for this patient.   Past Surgical History:  Procedure Laterality Date  . APPENDECTOMY    . cesarian section    . eblation      Prior to Admission medications   Medication Sig Start Date End Date Taking? Authorizing Provider  ciprofloxacin (CIPRO) 500 MG tablet Take 1 tablet (500 mg total) by mouth 2 (two) times daily. 08/13/14   Sharyn CreamerQuale, Mark, MD  ECHINACEA PO Take 1 capsule by mouth daily as needed (for cold symptoms.).    [provider]  ibuprofen (ADVIL,MOTRIN) 400 MG tablet Take 400 mg by mouth every 6 (six) hours as needed.    [provider]  meloxicam (MOBIC) 7.5 MG tablet Take 1 tablet (7.5 mg total) by mouth daily. 08/09/16 08/16/16  Orvil FeilWoods, Jaclyn M, PA-C  metroNIDAZOLE (FLAGYL) 500 MG tablet Take 1 tablet (500 mg total) by mouth 3 (three) times daily. 08/13/14   Sharyn CreamerQuale, Mark, MD  Probiotic Product (PROBIOTIC ACIDOPHILUS) CAPS Take 1 capsule by mouth daily.    [provider]  vitamin C (ASCORBIC ACID) 500 MG tablet Take 500 mg by mouth daily.     [provider]    Allergies Barbiturates and Codeine  Family History  Problem Relation Age of Onset  . Breast cancer Paternal Aunt 40  . Breast cancer Paternal Grandmother 3650    Social History Social History  Substance Use Topics  . Smoking status: Former Games developermoker  . Smokeless tobacco: Never Used  . Alcohol use Yes     Review of Systems  Constitutional: No fever/chills Eyes: No visual changes. No discharge ENT: No upper respiratory complaints. Cardiovascular: no chest pain. Respiratory: no cough. No SOB. Gastrointestinal: No abdominal pain.  No nausea, no vomiting.  No diarrhea.  No constipation. Musculoskeletal: Patient has right foot pain.  Skin: Negative for rash, abrasions, lacerations, ecchymosis. Neurological: Negative for headaches, focal weakness or numbness.   ____________________________________________   PHYSICAL EXAM:  VITAL SIGNS: ED Triage Vitals  Enc Vitals Group     BP 08/09/16 1727 (!) 147/84     Pulse Rate 08/09/16 1727 95     Resp 08/09/16 1727 18     Temp 08/09/16 1727 98.6 F (37 C)     Temp Source 08/09/16 1727 Oral     SpO2 08/09/16 1727 98 %     Weight 08/09/16 1727 200 lb (90.7 kg)     Height 08/09/16 1727 5\' 8"  (1.727 m)     Head Circumference --      Peak Flow --      Pain  Score 08/09/16 1719 2     Pain Loc --      Pain Edu? --      Excl. in GC? --      Constitutional: Alert and oriented. Well appearing and in no acute distress. Eyes: Conjunctivae are normal. PERRL. EOMI. Head: Atraumatic. Cardiovascular: Normal rate, regular rhythm. Normal S1 and S2.  Good peripheral circulation. Respiratory: Normal respiratory effort without tachypnea or retractions. Lungs CTAB. Good air entry to the bases with no decreased or absent breath sounds. Musculoskeletal: Patient has 5 out of 5 strength in the lower extremities bilaterally. Patient is able to perform dorsiflexion and plantar flexion at the right ankle. She is able to  move all 5 right toes. Palpable dorsalis pedis pulse bilaterally and symmetrically. Neurologic:  Normal speech and language. No gross focal neurologic deficits are appreciated.  Skin:  Skin is warm, dry and intact. No rash noted. Psychiatric: Mood and affect are normal. Speech and behavior are normal. Patient exhibits appropriate insight and judgement.   ____________________________________________   LABS (all labs ordered are listed, but only abnormal results are displayed)  Labs Reviewed - No data to display ____________________________________________  EKG   ____________________________________________  RADIOLOGY Geraldo Pitter, personally viewed and evaluated these images (plain radiographs) as part of my medical decision making, as well as reviewing the written report by the radiologist.  Dg Foot Complete Right  Result Date: 08/09/2016 CLINICAL DATA:  States she stepped in a hole on sat night. Now having pain to right foot. Mainly great toe area Min swelling noted to great toe EXAM: RIGHT FOOT COMPLETE - 3+ VIEW COMPARISON:  None. FINDINGS: There is no evidence of fracture or dislocation. There is no evidence of arthropathy or other focal bone abnormality. Soft tissues are unremarkable. IMPRESSION: Negative. Electronically Signed   By: Bary Richard M.D.   On: 08/09/2016 17:58    ____________________________________________    PROCEDURES  Procedure(s) performed:    Procedures    Medications - No data to display   ____________________________________________   INITIAL IMPRESSION / ASSESSMENT AND PLAN / ED COURSE  Pertinent labs & imaging results that were available during my care of the patient were reviewed by me and considered in my medical decision making (see chart for details).  Review of the [place] CSRS was performed in accordance of the [facility] prior to dispensing any controlled drugs.     Assessment and plan: Right foot contusion: Patient presents to  the emergency department with right foot pain after she stepped into a hole near her home. Physical exam was reassuring. X-ray examination conducted in the emergency department revealed no acute fractures or bony abnormalities. Patient was discharged with Mobic to be used daily for inflammation. A referral was made to podiatry, Dr. Orland Jarred. All patient questions were answered.     ____________________________________________  FINAL CLINICAL IMPRESSION(S) / ED DIAGNOSES  Final diagnoses:  Contusion of right foot, initial encounter      NEW MEDICATIONS STARTED DURING THIS VISIT:  New Prescriptions   MELOXICAM (MOBIC) 7.5 MG TABLET    Take 1 tablet (7.5 mg total) by mouth daily.        This chart was dictated using voice recognition software/Dragon. Despite best efforts to proofread, errors can occur which can change the meaning. Any change was purely unintentional.    Orvil Feil, PA-C 08/09/16 Delorise Shiner, MD 08/09/16 2055

## 2016-08-09 NOTE — ED Triage Notes (Signed)
States she stepped in a hole on sat  Now having pain to right foot  Mainly toe area  Min swelling noted to great toe

## 2017-08-29 ENCOUNTER — Encounter: Payer: Self-pay | Admitting: Emergency Medicine

## 2017-08-29 ENCOUNTER — Emergency Department: Payer: BLUE CROSS/BLUE SHIELD

## 2017-08-29 ENCOUNTER — Emergency Department
Admission: EM | Admit: 2017-08-29 | Discharge: 2017-08-29 | Disposition: A | Discharge status: Home / Self Care | Payer: BLUE CROSS/BLUE SHIELD | Attending: Emergency Medicine | Admitting: Emergency Medicine

## 2017-08-29 ENCOUNTER — Other Ambulatory Visit: Payer: Self-pay

## 2017-08-29 DIAGNOSIS — Z87891 Personal history of nicotine dependence: Secondary | ICD-10-CM | POA: Insufficient documentation

## 2017-08-29 DIAGNOSIS — K625 Hemorrhage of anus and rectum: Secondary | ICD-10-CM | POA: Insufficient documentation

## 2017-08-29 DIAGNOSIS — R1030 Lower abdominal pain, unspecified: Secondary | ICD-10-CM | POA: Insufficient documentation

## 2017-08-29 DIAGNOSIS — K449 Diaphragmatic hernia without obstruction or gangrene: Secondary | ICD-10-CM | POA: Insufficient documentation

## 2017-08-29 LAB — COMPREHENSIVE METABOLIC PANEL
ALBUMIN: 4 g/dL (ref 3.5–5.0)
ALT: 21 U/L (ref 0–44)
AST: 19 U/L (ref 15–41)
Alkaline Phosphatase: 53 U/L (ref 38–126)
Anion gap: 7 (ref 5–15)
BILIRUBIN TOTAL: 0.5 mg/dL (ref 0.3–1.2)
BUN: 11 mg/dL (ref 6–20)
CALCIUM: 9.2 mg/dL (ref 8.9–10.3)
CO2: 26 mmol/L (ref 22–32)
Chloride: 105 mmol/L (ref 98–111)
Creatinine, Ser: 0.52 mg/dL (ref 0.44–1.00)
GFR calc Af Amer: >60 mL/min (ref >60)
GFR calc non Af Amer: >60 mL/min (ref >60)
GLUCOSE: 106 mg/dL — AB (ref 70–99)
POTASSIUM: 3.6 mmol/L (ref 3.5–5.1)
Sodium: 138 mmol/L (ref 135–145)
TOTAL PROTEIN: 7.3 g/dL (ref 6.5–8.1)

## 2017-08-29 LAB — CBC
HEMATOCRIT: 34.7 % — AB (ref 35.0–47.0)
Hemoglobin: 11.4 g/dL — ABNORMAL LOW (ref 12.0–16.0)
MCH: 26.3 pg (ref 26.0–34.0)
MCHC: 32.8 g/dL (ref 32.0–36.0)
MCV: 80.4 fL (ref 80.0–100.0)
Platelets: 370 10*3/uL (ref 150–440)
RBC: 4.32 MIL/uL (ref 3.80–5.20)
RDW: 16.3 % — ABNORMAL HIGH (ref 11.5–14.5)
WBC: 5.1 10*3/uL (ref 3.6–11.0)

## 2017-08-29 LAB — CBC WITH DIFFERENTIAL/PLATELET
Basophils Absolute: 0.1 10*3/uL (ref 0–0.1)
Basophils Relative: 1 %
EOS ABS: 0.1 10*3/uL (ref 0–0.7)
Eosinophils Relative: 1 %
HEMATOCRIT: 35.1 % (ref 35.0–47.0)
HEMOGLOBIN: 11.7 g/dL — AB (ref 12.0–16.0)
LYMPHS ABS: 1.8 10*3/uL (ref 1.0–3.6)
LYMPHS PCT: 31 %
MCH: 26.8 pg (ref 26.0–34.0)
MCHC: 33.2 g/dL (ref 32.0–36.0)
MCV: 80.8 fL (ref 80.0–100.0)
MONOS PCT: 12 %
Monocytes Absolute: 0.7 10*3/uL (ref 0.2–0.9)
NEUTROS PCT: 55 %
Neutro Abs: 3.2 10*3/uL (ref 1.4–6.5)
Platelets: 367 10*3/uL (ref 150–440)
RBC: 4.35 MIL/uL (ref 3.80–5.20)
RDW: 16.3 % — ABNORMAL HIGH (ref 11.5–14.5)
WBC: 5.7 10*3/uL (ref 3.6–11.0)

## 2017-08-29 LAB — TYPE AND SCREEN
ABO/RH(D): O NEG
ANTIBODY SCREEN: NEGATIVE

## 2017-08-29 LAB — POCT PREGNANCY, URINE: Preg Test, Ur: NEGATIVE

## 2017-08-29 MED ORDER — METHYLPREDNISOLONE SODIUM SUCC 125 MG IJ SOLR
125.0000 mg | Freq: Once | INTRAMUSCULAR | Status: AC
  Administered 2017-08-29: 125 mg via INTRAVENOUS
  Filled 2017-08-29: qty 2

## 2017-08-29 MED ORDER — IOPAMIDOL (ISOVUE-370) INJECTION 76%
75.0000 mL | Freq: Once | INTRAVENOUS | Status: AC | PRN
  Administered 2017-08-29: 75 mL via INTRAVENOUS

## 2017-08-29 MED ORDER — LIDOCAINE VISCOUS HCL 2 % MT SOLN: 15.0000 mL | Freq: Four times a day (QID) | OROMUCOSAL | 0 refills | Status: DC | PRN

## 2017-08-29 MED ORDER — HYDROCORTISONE ACETATE 25 MG RE SUPP: 25.0000 mg | Freq: Two times a day (BID) | RECTAL | 1 refills | Status: AC

## 2017-08-29 MED ORDER — DIPHENHYDRAMINE HCL 50 MG/ML IJ SOLN
25.0000 mg | Freq: Once | INTRAMUSCULAR | Status: AC
  Administered 2017-08-29: 25 mg via INTRAVENOUS
  Filled 2017-08-29: qty 1

## 2017-08-29 NOTE — ED Provider Notes (Signed)
[facility] Emergency Department Provider Note       Time seen: ----------------------------------------- 3:53 PM on 08/29/2017 -----------------------------------------   I have reviewed the triage vital signs and the nursing notes.  HISTORY   Chief Complaint Rectal Bleeding    HPI Amanda Holt is a 47 y.o. female with a history of anemia, atrial flutter, diverticulitis and hypoglycemia who presents to the ED for rectal bleeding.  Patient has painful bright red blood per rectum including clots over the past week.  She has had some lower abdominal pain as well.  She has had some difficulty defecating but has been taking stool softeners and drinking lots of water.  She states this does not feel like when she had diverticulitis in the past.  She denies fevers, chills or other complaints.  Past Medical History:  Diagnosis Date  . Anemia   . Atrial flutter (HCC)   . Hx of diverticulitis of colon   . Hypoglycemia     There are no active problems to display for this patient.   Past Surgical History:  Procedure Laterality Date  . APPENDECTOMY    . cesarian section    . eblation      Allergies Barbiturates and Codeine  Social History Social History   Tobacco Use  . Smoking status: Former Games developermoker  . Smokeless tobacco: Never Used  Substance Use Topics  . Alcohol use: Yes    Comment: occasional  . Drug use: No   Review of Systems Constitutional: Negative for fever. Cardiovascular: Negative for chest pain. Respiratory: Negative for shortness of breath. Gastrointestinal: Positive for constipation, rectal bleeding Musculoskeletal: Negative for back pain. Skin: Negative for rash. Neurological: Negative for headaches, focal weakness or numbness.  All systems negative/normal/unremarkable except as stated in the HPI  ____________________________________________   PHYSICAL EXAM:  VITAL SIGNS: ED Triage Vitals  Enc Vitals Group     BP 08/29/17  1131 121/87     Pulse Rate 08/29/17 1131 86     Resp 08/29/17 1131 16     Temp 08/29/17 1131 98.3 F (36.8 C)     Temp Source 08/29/17 1131 Oral     SpO2 08/29/17 1131 99 %     Weight 08/29/17 1132 208 lb (94.3 kg)     Height 08/29/17 1132 5\' 8"  (1.727 m)     Head Circumference --      Peak Flow --      Pain Score 08/29/17 1131 4     Pain Loc --      Pain Edu? --      Excl. in GC? --    Constitutional: Alert and oriented. Well appearing and in no distress. Eyes: Conjunctivae are normal. Normal extraocular movements. ENT   Head: Normocephalic and atraumatic.   Nose: No congestion/rhinnorhea.   Mouth/Throat: Mucous membranes are moist.   Neck: No stridor. Cardiovascular: Normal rate, regular rhythm. No murmurs, rubs, or gallops. Respiratory: Normal respiratory effort without tachypnea nor retractions. Breath sounds are clear and equal bilaterally. No wheezes/rales/rhonchi. Gastrointestinal: Soft and nontender. Normal bowel sounds Rectal: Tenderness on examination, no obvious hemorrhoids although pain limits examination.  Heme positive. Musculoskeletal: Nontender with normal range of motion in extremities. No lower extremity tenderness nor edema. Neurologic:  Normal speech and language. No gross focal neurologic deficits are appreciated.  Skin:  Skin is warm, dry and intact. No rash noted. Psychiatric: Mood and affect are normal. Speech and behavior are normal.  ____________________________________________  ED COURSE:  As part of my  medical decision making, I reviewed the following data within the electronic MEDICAL RECORD NUMBER History obtained from family if available, nursing notes, old chart and ekg, as well as notes from prior ED visits. Patient presented for abdominal pain and bright red blood per rectum, we will assess with labs and imaging as indicated at this time.   Procedures ____________________________________________   LABS (pertinent  positives/negatives)  Labs Reviewed  COMPREHENSIVE METABOLIC PANEL - Abnormal; Notable for the following components:      Result Value   Glucose, Bld 106 (*)    All other components within normal limits  CBC - Abnormal; Notable for the following components:   Hemoglobin 11.4 (*)    HCT 34.7 (*)    RDW 16.3 (*)    All other components within normal limits  CBC WITH DIFFERENTIAL/PLATELET - Abnormal; Notable for the following components:   Hemoglobin 11.7 (*)    RDW 16.3 (*)    All other components within normal limits  POC URINE PREG, ED  POCT PREGNANCY, URINE  POC OCCULT BLOOD, ED  TYPE AND SCREEN    RADIOLOGY Images were viewed by me  CT the abdomen pelvis with contrast IMPRESSION: 1. No acute findings. 2. Small hiatal hernia. 3. Scattered descending and sigmoid diverticula. ____________________________________________  DIFFERENTIAL DIAGNOSIS   Diverticulosis, diverticulitis, anal fissure, hemorrhoidal bleeding  FINAL ASSESSMENT AND PLAN  Rectal bleeding   Plan: The patient had presented for rectal bleeding. Patient's labs did indicate some anemia although she has a history of menorrhagia. Patient's imaging did not reveal any acute process, possible diverticular bleed although it is painful and I suspect she does have hemorrhoids or a fissure.  She will need to see GI soon, we rechecked her hemoglobin here and it was not dropping significantly.  She is cleared for close outpatient follow-up at this time.   Ulice Dash, MD   Note: This note was generated in part or whole with voice recognition software. Voice recognition is usually quite accurate but there are transcription errors that can and very often do occur. I apologize for any typographical errors that were not detected and corrected.     Emily Filbert, MD 08/29/17 [phone]

## 2017-08-29 NOTE — ED Notes (Signed)
POCT UPREG=NEG 

## 2017-08-29 NOTE — ED Notes (Signed)
Patient transported to CT 

## 2017-08-29 NOTE — ED Triage Notes (Signed)
Pt via pov from home with rectal bleeding, including clots, lower abdominal pain. Has some difficulty defecating; has been taking stool softeners and drinking plenty of water.  Pt has hx of diverticulitis. NAD Noted.

## 2017-08-29 NOTE — ED Notes (Addendum)
CT tech called stated bring patient back to room with possible reaction.  Pt with redness to chest, back and neck.  Pt with hx anxiety also states gets rash with anxiety.  MD at bedside.  Pt chest rise even and unlabored, lungs clear throughout.

## 2017-08-29 NOTE — ED Notes (Signed)
Pt redness has decreased, denies SOB, chest rise even and unlabored, VSS.

## 2017-08-29 NOTE — ED Notes (Signed)
EDP at bedside with female EDT performing rectal exam.

## 2017-11-03 ENCOUNTER — Emergency Department: Payer: BLUE CROSS/BLUE SHIELD

## 2017-11-03 ENCOUNTER — Emergency Department
Admission: EM | Admit: 2017-11-03 | Discharge: 2017-11-03 | Disposition: A | Discharge status: Home / Self Care | Payer: BLUE CROSS/BLUE SHIELD | Attending: Emergency Medicine | Admitting: Emergency Medicine

## 2017-11-03 ENCOUNTER — Other Ambulatory Visit: Payer: Self-pay

## 2017-11-03 ENCOUNTER — Encounter: Payer: Self-pay | Admitting: Emergency Medicine

## 2017-11-03 DIAGNOSIS — Z79899 Other long term (current) drug therapy: Secondary | ICD-10-CM | POA: Diagnosis not present

## 2017-11-03 DIAGNOSIS — K2970 Gastritis, unspecified, without bleeding: Secondary | ICD-10-CM | POA: Insufficient documentation

## 2017-11-03 DIAGNOSIS — R112 Nausea with vomiting, unspecified: Secondary | ICD-10-CM | POA: Diagnosis not present

## 2017-11-03 DIAGNOSIS — E162 Hypoglycemia, unspecified: Secondary | ICD-10-CM | POA: Insufficient documentation

## 2017-11-03 DIAGNOSIS — Z87891 Personal history of nicotine dependence: Secondary | ICD-10-CM | POA: Diagnosis not present

## 2017-11-03 DIAGNOSIS — K2900 Acute gastritis without bleeding: Secondary | ICD-10-CM

## 2017-11-03 DIAGNOSIS — E876 Hypokalemia: Secondary | ICD-10-CM

## 2017-11-03 DIAGNOSIS — R109 Unspecified abdominal pain: Secondary | ICD-10-CM | POA: Diagnosis present

## 2017-11-03 LAB — URINALYSIS, COMPLETE (UACMP) WITH MICROSCOPIC
BILIRUBIN URINE: NEGATIVE
Glucose, UA: NEGATIVE mg/dL
Hgb urine dipstick: NEGATIVE
Ketones, ur: 20 mg/dL — AB
Leukocytes, UA: NEGATIVE
NITRITE: NEGATIVE
Protein, ur: NEGATIVE mg/dL
Specific Gravity, Urine: 1.013 (ref 1.005–1.030)
pH: 6 (ref 5.0–8.0)

## 2017-11-03 LAB — COMPREHENSIVE METABOLIC PANEL
ALBUMIN: 4.2 g/dL (ref 3.5–5.0)
ALT: 17 U/L (ref 0–44)
ANION GAP: 12 (ref 5–15)
AST: 20 U/L (ref 15–41)
Alkaline Phosphatase: 49 U/L (ref 38–126)
BILIRUBIN TOTAL: 0.7 mg/dL (ref 0.3–1.2)
BUN: 12 mg/dL (ref 6–20)
CHLORIDE: 100 mmol/L (ref 98–111)
CO2: 25 mmol/L (ref 22–32)
Calcium: 9 mg/dL (ref 8.9–10.3)
Creatinine, Ser: 0.47 mg/dL (ref 0.44–1.00)
GFR calc Af Amer: >60 mL/min (ref >60)
GFR calc non Af Amer: >60 mL/min (ref >60)
GLUCOSE: 141 mg/dL — AB (ref 70–99)
POTASSIUM: 3 mmol/L — AB (ref 3.5–5.1)
Sodium: 137 mmol/L (ref 135–145)
TOTAL PROTEIN: 8.1 g/dL (ref 6.5–8.1)

## 2017-11-03 LAB — CBC
HEMATOCRIT: 36.8 % (ref 35.0–47.0)
HEMOGLOBIN: 12.6 g/dL (ref 12.0–16.0)
MCH: 27.1 pg (ref 26.0–34.0)
MCHC: 34.4 g/dL (ref 32.0–36.0)
MCV: 78.7 fL — ABNORMAL LOW (ref 80.0–100.0)
Platelets: 351 10*3/uL (ref 150–440)
RBC: 4.67 MIL/uL (ref 3.80–5.20)
RDW: 17.1 % — ABNORMAL HIGH (ref 11.5–14.5)
WBC: 14.6 10*3/uL — ABNORMAL HIGH (ref 3.6–11.0)

## 2017-11-03 LAB — TROPONIN I: Troponin I: <0.03 ng/mL (ref <0.03)

## 2017-11-03 LAB — POCT PREGNANCY, URINE: PREG TEST UR: NEGATIVE

## 2017-11-03 LAB — LIPASE, BLOOD: LIPASE: 28 U/L (ref 11–51)

## 2017-11-03 MED ORDER — ONDANSETRON 4 MG PO TBDP: ORAL_TABLET | ORAL | 0 refills | Status: DC

## 2017-11-03 MED ORDER — POTASSIUM CHLORIDE CRYS ER 20 MEQ PO TBCR
EXTENDED_RELEASE_TABLET | ORAL | Status: AC
  Administered 2017-11-03: 40 meq via ORAL
  Filled 2017-11-03: qty 2

## 2017-11-03 MED ORDER — POTASSIUM CHLORIDE CRYS ER 20 MEQ PO TBCR
40.0000 meq | EXTENDED_RELEASE_TABLET | Freq: Once | ORAL | Status: AC
  Administered 2017-11-03: 40 meq via ORAL

## 2017-11-03 MED ORDER — ONDANSETRON HCL 4 MG PO TABS: 4.0000 mg | ORAL_TABLET | Freq: Three times a day (TID) | ORAL | 0 refills | Status: DC | PRN

## 2017-11-03 MED ORDER — ONDANSETRON HCL 4 MG/2ML IJ SOLN
4.0000 mg | INTRAMUSCULAR | Status: AC
  Administered 2017-11-03: 4 mg via INTRAVENOUS
  Filled 2017-11-03: qty 2

## 2017-11-03 MED ORDER — SUCRALFATE 1 G PO TABS: 1.0000 g | ORAL_TABLET | Freq: Four times a day (QID) | ORAL | 1 refills | Status: DC | PRN

## 2017-11-03 MED ORDER — PROMETHAZINE HCL 12.5 MG PO TABS: 25.0000 mg | ORAL_TABLET | Freq: Four times a day (QID) | ORAL | 0 refills | Status: DC | PRN

## 2017-11-03 MED ORDER — SODIUM CHLORIDE 0.9 % IV BOLUS
1000.0000 mL | Freq: Once | INTRAVENOUS | Status: AC
  Administered 2017-11-03: 1000 mL via INTRAVENOUS

## 2017-11-03 NOTE — ED Triage Notes (Signed)
Patient reports waking up after going to bed feeling a burning feeling from lower mid abd to upper mid abd. States then she began vomiting (vomited x5 total). Patient states she has felt chills, but no known fevers.

## 2017-11-03 NOTE — ED Provider Notes (Addendum)
[facility] Emergency Department Provider Note  ____________________________________________   First MD Initiated Contact with Patient 11/03/17 0530     (approximate)  I have reviewed the triage vital signs and the nursing notes.   HISTORY  Chief Complaint Abdominal Pain    HPI Amanda Holt is a 47 y.o. female with no contributory past medical history who presents for evaluation of acute onset severe upper abdominal pain with multiple episodes of nausea and vomiting.  She states that she was in her usual state of  Health when she went to bed last night and then awoke from a deep sleep with the severe sharp and burning pain.  It feels worse than any kind of acid reflux she has had in the past.  It does not radiate through to or from her back.  It does not radiate down into her lower abdomen.  No bowel complications.  She has been able to drink water intermittently but has had at least 5 episodes of emesis.  She feels dehydrated.  She denies fever/chills, chest pain, shortness of breath, and dysuria.  She states that she made dinner tonight and multiple other people shared it and they did not become ill.  Past Medical History:  Diagnosis Date  . Anemia   . Atrial flutter (HCC)   . Hx of diverticulitis of colon   . Hypoglycemia     There are no active problems to display for this patient.   Past Surgical History:  Procedure Laterality Date  . APPENDECTOMY    . cesarian section    . eblation      Prior to Admission medications   Medication Sig Start Date End Date Taking? Authorizing Provider  ciprofloxacin (CIPRO) 500 MG tablet Take 1 tablet (500 mg total) by mouth 2 (two) times daily. 08/13/14   Sharyn Creamer, MD  ECHINACEA PO Take 1 capsule by mouth daily as needed (for cold symptoms.).    [provider]  hydrocortisone (ANUSOL-HC) 25 MG suppository Place 1 suppository (25 mg total) rectally every 12 (twelve) hours. 08/29/17 08/29/18  Emily Filbert, MD  ibuprofen (ADVIL,MOTRIN) 400 MG tablet Take 400 mg by mouth every 6 (six) hours as needed.    [provider]  lidocaine (XYLOCAINE) 2 % solution Use as directed 15 mLs in the mouth or throat every 6 (six) hours as needed (to apply rectally for pain as needed). 08/29/17   Emily Filbert, MD  metroNIDAZOLE (FLAGYL) 500 MG tablet Take 1 tablet (500 mg total) by mouth 3 (three) times daily. 08/13/14   Sharyn Creamer, MD  ondansetron (ZOFRAN ODT) 4 MG disintegrating tablet Allow 1-2 tablets to dissolve in your mouth every 8 hours as needed for nausea/vomiting 11/03/17   Loleta Rose, MD  Probiotic Product (PROBIOTIC ACIDOPHILUS) CAPS Take 1 capsule by mouth daily.    [provider]  sucralfate (CARAFATE) 1 g tablet Take 1 tablet (1 g total) by mouth 4 (four) times daily as needed (for abdominal discomfort, nausea, and/or vomiting). 11/03/17   Loleta Rose, MD  vitamin C (ASCORBIC ACID) 500 MG tablet Take 500 mg by mouth daily.    [provider]    Allergies Barbiturates and Codeine  Family History  Problem Relation Age of Onset  . Breast cancer Paternal Aunt 40  . Breast cancer Paternal Grandmother 3    Social History Social History   Tobacco Use  . Smoking status: Former Games developer  . Smokeless tobacco: Never Used  Substance  Use Topics  . Alcohol use: Yes    Comment: occasional  . Drug use: No    Review of Systems Constitutional: No fever/chills Eyes: No visual changes. ENT: No sore throat. Cardiovascular: Denies chest pain. Respiratory: Denies shortness of breath. Gastrointestinal: Acute onset severe upper abdominal burning and sharp pain associated with multiple episodes of vomiting, as described above Genitourinary: Negative for dysuria. Musculoskeletal: Negative for neck pain.  Negative for back pain. Integumentary: Negative for rash. Neurological: Negative for headaches, focal weakness or  numbness.   ____________________________________________   PHYSICAL EXAM:  VITAL SIGNS: ED Triage Vitals  Enc Vitals Group     BP 11/03/17 0217 134/80     Pulse Rate 11/03/17 0217 99     Resp 11/03/17 0217 20     Temp 11/03/17 0217 97.9 F (36.6 C)     Temp Source 11/03/17 0217 Oral     SpO2 11/03/17 0217 99 %     Weight 11/03/17 0219 97.5 kg (215 lb)     Height 11/03/17 0219 1.727 m (5\' 8" )     Head Circumference --      Peak Flow --      Pain Score 11/03/17 0217 2     Pain Loc --      Pain Edu? --      Excl. in GC? --     Constitutional: Alert and oriented. Well appearing and in no acute distress. Eyes: Conjunctivae are normal.  Head: Atraumatic. Nose: No congestion/rhinnorhea. Mouth/Throat: Mucous membranes are moist. Neck: No stridor.  No meningeal signs.   Cardiovascular: Borderline tachycardia, regular rhythm. Good peripheral circulation. Grossly normal heart sounds. Respiratory: Normal respiratory effort.  No retractions. Lungs CTAB. Gastrointestinal: Severe tenderness to palpation of the epigastrium and right upper quadrant with positive Murphy sign.  No lower abdominal tenderness to palpation and no rebound or guarding.  Negative Rovsing sign. Musculoskeletal: No lower extremity tenderness nor edema. No gross deformities of extremities. Neurologic:  Normal speech and language. No gross focal neurologic deficits are appreciated.  Skin:  Skin is warm, dry and intact. No rash noted. Psychiatric: Mood and affect are normal. Speech and behavior are normal  ____________________________________________   LABS (all labs ordered are listed, but only abnormal results are displayed)  Labs Reviewed  COMPREHENSIVE METABOLIC PANEL - Abnormal; Notable for the following components:      Result Value   Potassium 3.0 (*)    Glucose, Bld 141 (*)    All other components within normal limits  CBC - Abnormal; Notable for the following components:   WBC 14.6 (*)    MCV 78.7  (*)    RDW 17.1 (*)    All other components within normal limits  URINALYSIS, COMPLETE (UACMP) WITH MICROSCOPIC - Abnormal; Notable for the following components:   Color, Urine YELLOW (*)    APPearance HAZY (*)    Ketones, ur 20 (*)    Bacteria, UA RARE (*)    All other components within normal limits  LIPASE, BLOOD  TROPONIN I  POC URINE PREG, ED  POCT PREGNANCY, URINE   ____________________________________________  EKG  ED ECG REPORT I, Loleta Rose, the attending physician, personally viewed and interpreted this ECG.  Date: 11/03/2017 EKG Time: 2:23 AM Rate: 88 Rhythm: normal sinus rhythm QRS Axis: normal Intervals: normal ST/T Wave abnormalities: Non-specific ST segment / T-wave changes, but no evidence of acute ischemia. Narrative Interpretation: no definitive evidence of acute ischemia  ____________________________________________  RADIOLOGY   ED MD interpretation: Abdominal  ultrasound is pending  Official radiology report(s): No results found.  ____________________________________________   PROCEDURES  Critical Care performed: No   Procedure(s) performed:   Procedures   ____________________________________________   INITIAL IMPRESSION / ASSESSMENT AND PLAN / ED COURSE  As part of my medical decision making, I reviewed the following data within the electronic MEDICAL RECORD NUMBER Nursing notes reviewed and incorporated, Labs reviewed , Old chart reviewed, Patient signed out to Dr. Shaune Pollack and Notes from prior ED visits    Differential diagnosis includes, but is not limited to, acute gastritis possibly secondary to acid reflux, biliary colic/gallbladder disease, SBO/ileus, acute infection such as a viral illness or less likely bacterial infection.  The patient is feeling much better than she did previously and had no specific medications to help.  She is tolerating water by mouth.  Vital signs are generally reassuring although she does have some mild  tachycardia.  Lab work is notable for leukocytosis of 14.6, a potassium of 3.0, and ketones in her urine, all of which are supportive of her history of present illness and the symptoms she is describing.  She has a normal lipase, negative urine pregnancy test, and no evidence of a urinary tract infection.  Given the tenderness to palpation of her abdomen and the epigastrium and right upper quadrant, I think it is reasonable to obtain an ultrasound to rule out biliary colic given that she has no LFT elevation.  She reports that she is been careful with her diet recently and has been losing weight but does believe that she has worse acid reflux after eating and this could be indicative of biliary colic.  After she gets her ultrasound we will try a GI cocktail to see if this further soothe her stomach as well as a potassium supplement for her GI losses.  I have also ordered an IV and 1 L normal saline IV bolus and 4 mg of IV Zofran.    Clinical Course as of Nov 03 729  Fri Nov 03, 2017  0708 Transferring ED care to Dr. Shaune Pollack to reassess after U/S results are back.   [CF]    Clinical Course User Index [CF] Loleta Rose, MD    ____________________________________________  FINAL CLINICAL IMPRESSION(S) / ED DIAGNOSES  Final diagnoses:  Acute gastritis, presence of bleeding unspecified, unspecified gastritis type  Non-intractable vomiting with nausea, unspecified vomiting type     MEDICATIONS GIVEN DURING THIS VISIT:  Medications  sodium chloride 0.9 % bolus 1,000 mL (1,000 mLs Intravenous New Bag/Given 11/03/17 0642)  ondansetron (ZOFRAN) injection 4 mg (4 mg Intravenous Given 11/03/17 0646)     ED Discharge Orders         Ordered    ondansetron (ZOFRAN ODT) 4 MG disintegrating tablet     11/03/17 0713    sucralfate (CARAFATE) 1 g tablet  4 times daily PRN     11/03/17 6045           Note:  This document was prepared using Dragon voice recognition software and may include  unintentional dictation errors.    Loleta Rose, MD 11/03/17 4098    Loleta Rose, MD 11/03/17 [phone]

## 2017-11-03 NOTE — ED Notes (Signed)
Pt to ultrasound at this time.

## 2017-11-03 NOTE — ED Notes (Signed)
Pt up to bathroom and back in bed at this time. Awaiting EDP update.

## 2017-11-03 NOTE — ED Provider Notes (Addendum)
[facility]  I accepted care from Dr. York Cerise ____________________________________________    LABS (pertinent positives/negatives)  I, Governor Rooks, MD have personally reviewed the lab reports noted below.  Labs Reviewed  COMPREHENSIVE METABOLIC PANEL - Abnormal; Notable for the following components:      Result Value   Potassium 3.0 (*)    Glucose, Bld 141 (*)    All other components within normal limits  CBC - Abnormal; Notable for the following components:   WBC 14.6 (*)    MCV 78.7 (*)    RDW 17.1 (*)    All other components within normal limits  URINALYSIS, COMPLETE (UACMP) WITH MICROSCOPIC - Abnormal; Notable for the following components:   Color, Urine YELLOW (*)    APPearance HAZY (*)    Ketones, ur 20 (*)    Bacteria, UA RARE (*)    All other components within normal limits  LIPASE, BLOOD  TROPONIN I  POC URINE PREG, ED  POCT PREGNANCY, URINE     ____________________________________________    RADIOLOGY All xrays were viewed by me. Imaging interpreted by radiologist.  I, Governor Rooks MD have personally reviewed the imaging report noted below.  Ultrasound right upper quadrant: Radiologist report reviewed: Study within normal limits.  ____________________________________________   PROCEDURES  Procedure(s) performed: None  Procedures  Critical Care performed: None  ____________________________________________   INITIAL IMPRESSION / ASSESSMENT AND PLAN / ED COURSE   Pertinent labs & imaging results that were available during my care of the patient were reviewed by me and considered in my medical decision making (see chart for details).   Patient with epigastric pain and vomiting, with clinical dehydration and hypokalemia, treated here in the emergency department.  I reviewed the ultrasound which is reassuring and negative for gallstones.  On reevaluation, patient is feeling somewhat better.  No lower abdominal pain on  examination.  She states that she has some chronic pelvic pain, but the pain that she is having here is very epigastric.  We discussed 1 to 2 weeks of omeprazole for acid reflux component in the setting of potential GI viral illness.  I will send her with nausea medications.    CONSULTATIONS: None    Patient / Family / Caregiver informed of clinical course, medical decision-making process, and agree with plan.   I discussed return precautions, follow-up instructions, and discharged instructions with patient and/or family.   Discharge instructions: Per Dr. York Cerise: You have been seen in the Emergency Department (ED) for abdominal pain.  Your evaluation did not identify a clear cause of your symptoms but was generally reassuring.  Please follow up as instructed above regarding today's emergent visit and the symptoms that are bothering you.  Return to the ED if your abdominal pain worsens or fails to improve, you develop bloody vomiting, bloody diarrhea, you are unable to tolerate fluids due to vomiting, fever greater than 101, or other symptoms that concern you.   ____________________________________________   FINAL CLINICAL IMPRESSION(S) / ED DIAGNOSES  Final diagnoses:  Acute gastritis, presence of bleeding unspecified, unspecified gastritis type  Non-intractable vomiting with nausea, unspecified vomiting type        Governor Rooks, MD 11/03/17 1610    Governor Rooks, MD 11/03/17 [phone]

## 2017-11-03 NOTE — Discharge Instructions (Addendum)

## 2017-11-03 NOTE — ED Notes (Signed)
Pt in bed, placed on cardiac monitor and given warm blanket. John,RN aware of pt.

## 2017-11-03 NOTE — ED Notes (Signed)
Pt alert and oriented X4, active, cooperative, pt in NAD. RR even and unlabored, color WNL.  Pt informed to return if any life threatening symptoms occur.  Discharge and followup instructions reviewed. Pt ambulates safely. 

## 2020-05-22 ENCOUNTER — Other Ambulatory Visit: Payer: Self-pay

## 2020-05-22 ENCOUNTER — Emergency Department (HOSPITAL_COMMUNITY)
Admission: EM | Admit: 2020-05-22 | Discharge: 2020-05-23 | Disposition: A | Discharge status: Home / Self Care | Payer: BC Managed Care – PPO | Attending: Emergency Medicine | Admitting: Emergency Medicine

## 2020-05-22 ENCOUNTER — Emergency Department (HOSPITAL_COMMUNITY): Payer: BC Managed Care – PPO

## 2020-05-22 ENCOUNTER — Encounter (HOSPITAL_COMMUNITY): Payer: Self-pay

## 2020-05-22 DIAGNOSIS — Z20822 Contact with and (suspected) exposure to covid-19: Secondary | ICD-10-CM | POA: Diagnosis not present

## 2020-05-22 DIAGNOSIS — R42 Dizziness and giddiness: Secondary | ICD-10-CM | POA: Diagnosis not present

## 2020-05-22 DIAGNOSIS — R112 Nausea with vomiting, unspecified: Secondary | ICD-10-CM | POA: Diagnosis not present

## 2020-05-22 DIAGNOSIS — Z79899 Other long term (current) drug therapy: Secondary | ICD-10-CM | POA: Diagnosis not present

## 2020-05-22 DIAGNOSIS — Z87891 Personal history of nicotine dependence: Secondary | ICD-10-CM | POA: Insufficient documentation

## 2020-05-22 DIAGNOSIS — I10 Essential (primary) hypertension: Secondary | ICD-10-CM | POA: Insufficient documentation

## 2020-05-22 LAB — LIPASE, BLOOD: Lipase: 41 U/L (ref 11–51)

## 2020-05-22 LAB — CBC WITH DIFFERENTIAL/PLATELET
Abs Immature Granulocytes: 0.02 10*3/uL (ref 0.00–0.07)
Basophils Absolute: 0 10*3/uL (ref 0.0–0.1)
Basophils Relative: 1 %
Eosinophils Absolute: 0 10*3/uL (ref 0.0–0.5)
Eosinophils Relative: 1 %
HCT: 41.5 % (ref 36.0–46.0)
Hemoglobin: 13.4 g/dL (ref 12.0–15.0)
Immature Granulocytes: 0 %
Lymphocytes Relative: 22 %
Lymphs Abs: 1.3 10*3/uL (ref 0.7–4.0)
MCH: 28.1 pg (ref 26.0–34.0)
MCHC: 32.3 g/dL (ref 30.0–36.0)
MCV: 87 fL (ref 80.0–100.0)
Monocytes Absolute: 0.6 10*3/uL (ref 0.1–1.0)
Monocytes Relative: 10 %
Neutro Abs: 4 10*3/uL (ref 1.7–7.7)
Neutrophils Relative %: 66 %
Platelets: 353 10*3/uL (ref 150–400)
RBC: 4.77 MIL/uL (ref 3.87–5.11)
RDW: 14 % (ref 11.5–15.5)
WBC: 6 10*3/uL (ref 4.0–10.5)
nRBC: 0 % (ref 0.0–0.2)

## 2020-05-22 LAB — URINALYSIS, DIPSTICK ONLY
Bilirubin Urine: NEGATIVE
Glucose, UA: NEGATIVE mg/dL
Ketones, ur: NEGATIVE mg/dL
Leukocytes,Ua: NEGATIVE
Nitrite: NEGATIVE
Protein, ur: NEGATIVE mg/dL
Specific Gravity, Urine: 1.015 (ref 1.005–1.030)
pH: 9 — ABNORMAL HIGH (ref 5.0–8.0)

## 2020-05-22 LAB — COMPREHENSIVE METABOLIC PANEL
ALT: 20 U/L (ref 0–44)
AST: 18 U/L (ref 15–41)
Albumin: 4.1 g/dL (ref 3.5–5.0)
Alkaline Phosphatase: 61 U/L (ref 38–126)
Anion gap: 11 (ref 5–15)
BUN: 8 mg/dL (ref 6–20)
CO2: 24 mmol/L (ref 22–32)
Calcium: 9.8 mg/dL (ref 8.9–10.3)
Chloride: 103 mmol/L (ref 98–111)
Creatinine, Ser: 0.55 mg/dL (ref 0.44–1.00)
GFR, Estimated: >60 mL/min (ref >60)
Glucose, Bld: 155 mg/dL — ABNORMAL HIGH (ref 70–99)
Potassium: 3.1 mmol/L — ABNORMAL LOW (ref 3.5–5.1)
Sodium: 138 mmol/L (ref 135–145)
Total Bilirubin: 0.6 mg/dL (ref 0.3–1.2)
Total Protein: 7.8 g/dL (ref 6.5–8.1)

## 2020-05-22 LAB — RESP PANEL BY RT-PCR (FLU A&B, COVID) ARPGX2
Influenza A by PCR: NEGATIVE
Influenza B by PCR: NEGATIVE
SARS Coronavirus 2 by RT PCR: NEGATIVE

## 2020-05-22 LAB — I-STAT BETA HCG BLOOD, ED (MC, WL, AP ONLY): I-stat hCG, quantitative: <5 m[IU]/mL (ref <5)

## 2020-05-22 LAB — MAGNESIUM: Magnesium: 2.1 mg/dL (ref 1.7–2.4)

## 2020-05-22 MED ORDER — POTASSIUM CHLORIDE CRYS ER 20 MEQ PO TBCR
40.0000 meq | EXTENDED_RELEASE_TABLET | Freq: Once | ORAL | Status: AC
  Administered 2020-05-22: 40 meq via ORAL
  Filled 2020-05-22: qty 2

## 2020-05-22 MED ORDER — LACTATED RINGERS IV BOLUS
500.0000 mL | Freq: Once | INTRAVENOUS | Status: AC
  Administered 2020-05-23: 500 mL via INTRAVENOUS

## 2020-05-22 MED ORDER — MECLIZINE HCL 25 MG PO TABS
25.0000 mg | ORAL_TABLET | Freq: Once | ORAL | Status: AC
  Administered 2020-05-22: 25 mg via ORAL
  Filled 2020-05-22: qty 1

## 2020-05-22 MED ORDER — LORAZEPAM 2 MG/ML IJ SOLN
1.0000 mg | Freq: Once | INTRAMUSCULAR | Status: AC | PRN
  Administered 2020-05-22: 1 mg via INTRAVENOUS
  Filled 2020-05-22: qty 1

## 2020-05-22 MED ORDER — ONDANSETRON 4 MG PO TBDP: 4.0000 mg | ORAL_TABLET | Freq: Three times a day (TID) | ORAL | 0 refills | Status: DC | PRN

## 2020-05-22 MED ORDER — MECLIZINE HCL 25 MG PO TABS: 25.0000 mg | ORAL_TABLET | Freq: Three times a day (TID) | ORAL | 0 refills | Status: DC | PRN

## 2020-05-22 NOTE — ED Triage Notes (Signed)
Pt reports dizziness while at work this morning, pt left work to lay down at home due to worsening symptoms. Pt also reports worsening acid reflux, 1 episode of vomiting. Feels "off" denies headache, buzzing in left ear. Denies chest pain or sob, pt a.o

## 2020-05-22 NOTE — ED Triage Notes (Signed)
Emergency Medicine Provider Triage Evaluation Note  Marci Polito , a 50 y.o. female  was evaluated in triage.  Pt complains of dizziness, ringing in the left ear, nausea, vomiting, fatigue.  Patient reports that her symptoms began this morning.  Symptoms are intermittent.  Patient denies any slurred speech, facial droop, headache, chest pain, SHOB, numbness or tingling to extremities, weakness of extremities.  Review of Systems  Positive: izziness, ringing in the left ear, nausea, vomiting, fatigue Negative: slurred speech, facial droop, headache, chest pain, SHOB, numbness or tingling to extremities, weakness of extremities.  Physical Exam  BP (!) 162/104   Pulse 82   Temp 97.7 F (36.5 C) (Oral)   Resp 18   SpO2 99%  Gen:   Awake, no distress   HEENT:  Atraumatic  Resp:  Normal effort  Cardiac:  Normal rate  Abd:   Nondistended, nontender  MSK:   Moves extremities without difficulty  Neuro:  Speech clear   Medical Decision Making  Medically screening exam initiated at 2:34 PM.  Appropriate orders placed.  Najla Aughenbaugh was informed that the remainder of the evaluation will be completed by another provider, this initial triage assessment does not replace that evaluation, and the importance of remaining in the ED until their evaluation is complete.  Clinical Impression   The patient appears stable so that the remainder of the work up may be completed by another provider.      Haskel Schroeder, [place] 05/22/20 1438

## 2020-05-22 NOTE — ED Provider Notes (Signed)
[facility] Note   CSN: [id number] Arrival date & time: 05/22/20  1419     History Chief Complaint  Patient presents with  . Dizziness    Amanda Holt is a 50 y.o. female.  The history is provided by the patient and medical records.  Dizziness  Amanda Holt is a 50 y.o. female who presents to the Emergency Department complaining of vertigo. She presents the emergency department complaining of vertigo that started around 740 this morning. She states that she was going to sit down at work after she ate her breakfast [place] and began to feel dizzy described as a room spinning sensation. She attempted to rest and lay down but when she got back up she had persistent symptoms. She has associated nausea and vomiting. She has experienced brief episodes of vertigo in the past but nothing the severe or long lasting. She has a bringing in her left ear. She has a history of a fib, status post ablation. She is not currently on anticoagulation. She does have intermittent episodes of a fib since the ablation but the episodes are brief. She also has a history of hypertension but did not take her medications today due to concern that she would vomit them up. No vision changes, numbness, weakness. She is unable to ambulate without difficulty due to feeling like she is going to fall over.    Past Medical History:  Diagnosis Date  . Anemia   . Atrial flutter (HCC)   . Hx of diverticulitis of colon   . Hypoglycemia     There are no problems to display for this patient.   Past Surgical History:  Procedure Laterality Date  . APPENDECTOMY    . cesarian section    . eblation       OB History    Gravida  4   Para      Term      Preterm      AB      Living        SAB      IAB      Ectopic      Multiple      Live Births              Family History  Problem Relation Age of Onset  . Breast cancer Paternal Aunt 40  . Breast  cancer Paternal Grandmother [id number]    Social History   Tobacco Use  . Smoking status: Former Games developermoker  . Smokeless tobacco: Never Used  Vaping Use  . Vaping Use: Never used  Substance Use Topics  . Alcohol use: Yes    Comment: occasional  . Drug use: No    Home Medications Prior to Admission medications   Medication Sig Start Date End Date Taking? Authorizing Provider  meclizine (ANTIVERT) 25 MG tablet Take 1 tablet (25 mg total) by mouth 3 (three) times daily as needed for dizziness. 05/22/20  Yes Tilden Fossaees, Kataleya Zaugg, MD  ondansetron (ZOFRAN ODT) 4 MG disintegrating tablet Take 1 tablet (4 mg total) by mouth every 8 (eight) hours as needed for nausea or vomiting. 05/22/20  Yes Tilden Fossaees, Tomiko Schoon, MD  chlorthalidone (HYGROTON) 25 MG tablet Take 12.5 mg by mouth daily. 10/27/17   [provider]  ciprofloxacin (CIPRO) 500 MG tablet Take 1 tablet (500 mg total) by mouth 2 (two) times daily. Patient not taking: Reported on 11/03/2017 08/13/14   Sharyn CreamerQuale, Mark, MD  ECHINACEA PO Take 1 capsule by  mouth daily as needed (for cold symptoms.).    [provider]  ibuprofen (ADVIL,MOTRIN) 400 MG tablet Take 400 mg by mouth every 6 (six) hours as needed for headache or mild pain.     [provider]  lidocaine (XYLOCAINE) 2 % solution Use as directed 15 mLs in the mouth or throat every 6 (six) hours as needed (to apply rectally for pain as needed). Patient not taking: Reported on 11/03/2017 08/29/17   Emily Filbert, MD  metroNIDAZOLE (FLAGYL) 500 MG tablet Take 1 tablet (500 mg total) by mouth 3 (three) times daily. Patient not taking: Reported on 11/03/2017 08/13/14   Sharyn Creamer, MD  norethindrone (MICRONOR,CAMILA,ERRIN) 0.35 MG tablet Take 1 tablet by mouth daily. 10/27/17   [provider]  ondansetron (ZOFRAN) 4 MG tablet Take 1 tablet (4 mg total) by mouth every 8 (eight) hours as needed for nausea or vomiting. 11/03/17   Governor Rooks, MD  Probiotic Product (PROBIOTIC  ACIDOPHILUS) CAPS Take 1 capsule by mouth daily.    [provider]  promethazine (PHENERGAN) 12.5 MG tablet Take 2 tablets (25 mg total) by mouth every 6 (six) hours as needed for nausea or vomiting. 11/03/17   Governor Rooks, MD  sucralfate (CARAFATE) 1 g tablet Take 1 tablet (1 g total) by mouth 4 (four) times daily as needed (for abdominal discomfort, nausea, and/or vomiting). 11/03/17   Loleta Rose, MD  vitamin C (ASCORBIC ACID) 500 MG tablet Take 500 mg by mouth daily.    [provider]    Allergies    Barbiturates and Codeine  Review of Systems   Review of Systems  Neurological: Positive for dizziness.  All other systems reviewed and are negative.   Physical Exam Updated Vital Signs BP (!) 143/92   Pulse 93   Temp 98.1 F (36.7 C)   Resp 18   SpO2 100%   Physical Exam Vitals and nursing note reviewed.  Constitutional:      Appearance: She is well-developed.  HENT:     Head: Normocephalic and atraumatic.     Comments: Left TM is within normal limits. Right TM is obscured by cerumen Cardiovascular:     Rate and Rhythm: Normal rate and regular rhythm.     Heart sounds: No murmur heard.   Pulmonary:     Effort: Pulmonary effort is normal. No respiratory distress.     Breath sounds: Normal breath sounds.  Abdominal:     Palpations: Abdomen is soft.     Tenderness: There is no abdominal tenderness. There is no guarding or rebound.  Musculoskeletal:        General: No tenderness.  Skin:    General: Skin is warm and dry.  Neurological:     Mental Status: She is alert and oriented to person, place, and time.     Comments: No asymmetry of facial movements. Visual fields grossly intact. Five out of five strength in all four extremities. No ataxia on finger to nose bilaterally  Psychiatric:        Behavior: Behavior normal.     ED Results / Procedures / Treatments   Labs (all labs ordered are listed, but only abnormal results are displayed) Labs  Reviewed  COMPREHENSIVE METABOLIC PANEL - Abnormal; Notable for the following components:      Result Value   Potassium 3.1 (*)    Glucose, Bld 155 (*)    All other components within normal limits  URINALYSIS, DIPSTICK ONLY - Abnormal; Notable for  the following components:   APPearance CLOUDY (*)    pH 9.0 (*)    Hgb urine dipstick SMALL (*)    All other components within normal limits  RESP PANEL BY RT-PCR (FLU A&B, COVID) ARPGX2  CBC WITH DIFFERENTIAL/PLATELET  MAGNESIUM  LIPASE, BLOOD  I-STAT BETA HCG BLOOD, ED ([facility], [facility], [facility] ONLY)    EKG EKG Interpretation  Date/Time:  Friday May 22 2020 14:39:50 EDT Ventricular Rate:  84 PR Interval:  142 QRS Duration: 80 QT Interval:  406 QTC Calculation: 479 R Axis:   57 Text Interpretation: Normal sinus rhythm Normal ECG Confirmed by Tilden Fossa [phone]) on 05/22/2020 6:44:41 PM   Radiology CT Head Wo Contrast  Result Date: 05/22/2020 CLINICAL DATA:  Dizziness EXAM: CT HEAD WITHOUT CONTRAST TECHNIQUE: Contiguous axial images were obtained from the base of the skull through the vertex without intravenous contrast. COMPARISON:  July 05, 2011 MRI brain and head CT FINDINGS: Brain: No evidence of acute large vascular territory infarction, hemorrhage, hydrocephalus, extra-axial collection or mass lesion/mass effect. Vascular: No hyperdense vessel or unexpected calcification. Skull: Normal. Negative for fracture or focal lesion. Sinuses/Orbits: Minimal layering secretions in the bilateral sphenoid sinuses otherwise the paranasal sinuses and mastoid air cells are predominantly clear. Orbits are grossly unremarkable. Other: None IMPRESSION: 1. No acute intracranial findings. Electronically Signed   By: Maudry Mayhew MD   On: 05/22/2020 16:52   MR BRAIN WO CONTRAST  Result Date: 05/22/2020 CLINICAL DATA:  Central vertigo EXAM: MRI HEAD WITHOUT CONTRAST TECHNIQUE: Multiplanar, multiecho pulse sequences of the brain and surrounding structures were  obtained without intravenous contrast. COMPARISON:  07/05/2011 FINDINGS: Brain: No acute infarct, mass effect or extra-axial collection. No acute or chronic hemorrhage. Normal white matter signal, parenchymal volume and CSF spaces. The midline structures are normal. Vascular: Major flow voids are preserved. Skull and upper cervical spine: Normal calvarium and skull base. Visualized upper cervical spine and soft tissues are normal. Sinuses/Orbits:No paranasal sinus fluid levels or advanced mucosal thickening. No mastoid or middle ear effusion. Normal orbits. IMPRESSION: Normal brain MRI. Electronically Signed   By: Deatra Robinson M.D.   On: 05/22/2020 22:42    Procedures Procedures   Medications Ordered in ED Medications  lactated ringers bolus 500 mL (has no administration in time range)  meclizine (ANTIVERT) tablet 25 mg (25 mg Oral Given 05/22/20 2142)  potassium chloride SA (KLOR-CON) CR tablet 40 mEq (40 mEq Oral Given 05/22/20 2142)  LORazepam (ATIVAN) injection 1 mg (1 mg Intravenous Given 05/22/20 2141)    ED Course  I have reviewed the triage vital signs and the nursing notes.  Pertinent labs & imaging results that were available during my care of the patient were reviewed by me and considered in my medical decision making (see chart for details).    MDM Rules/Calculators/A&P                         patient with history of a fib status post ablation here for evaluation of vertigo. She is neurologically intact on evaluation but does have persistent symptoms. CT head is negative. Due to ongoing symptoms and history of a fib and MRI was obtained. MRI is negative for acute abnormality. She is feeling improved following treatment in the department. Discussed with patient home care for vertigo. Discussed outpatient follow-up and return precautions.  Final Clinical Impression(s) / ED Diagnoses Final diagnoses:  Vertigo    Rx / DC Orders ED Discharge Orders  Ordered    ondansetron  (ZOFRAN ODT) 4 MG disintegrating tablet  Every 8 hours PRN        05/22/20 2331    meclizine (ANTIVERT) 25 MG tablet  3 times daily PRN        05/22/20 2331           Tilden Fossa, MD 05/22/20 [phone]

## 2020-05-23 NOTE — ED Notes (Signed)
Patient verbalizes understanding of discharge instructions. Prescriptions reviewed. Opportunity for questioning and answers were provided. Armband removed by staff, pt discharged from ED vis wheelchair with husband.

## 2020-10-16 ENCOUNTER — Other Ambulatory Visit: Payer: Self-pay | Admitting: Family Medicine

## 2020-10-16 DIAGNOSIS — Z1231 Encounter for screening mammogram for malignant neoplasm of breast: Secondary | ICD-10-CM

## 2020-10-26 ENCOUNTER — Ambulatory Visit
Admission: RE | Admit: 2020-10-26 | Discharge: 2020-10-26 | Disposition: A | Discharge status: Home / Self Care | Payer: BC Managed Care – PPO | Source: Ambulatory Visit | Attending: Family Medicine | Admitting: Family Medicine

## 2020-10-26 ENCOUNTER — Other Ambulatory Visit: Payer: Self-pay

## 2020-10-26 DIAGNOSIS — Z1231 Encounter for screening mammogram for malignant neoplasm of breast: Secondary | ICD-10-CM

## 2020-12-04 DIAGNOSIS — L409 Psoriasis, unspecified: Secondary | ICD-10-CM | POA: Diagnosis not present

## 2020-12-04 DIAGNOSIS — L309 Dermatitis, unspecified: Secondary | ICD-10-CM | POA: Diagnosis not present

## 2020-12-04 DIAGNOSIS — G4700 Insomnia, unspecified: Secondary | ICD-10-CM | POA: Diagnosis not present

## 2020-12-04 DIAGNOSIS — G4733 Obstructive sleep apnea (adult) (pediatric): Secondary | ICD-10-CM | POA: Diagnosis not present

## 2020-12-07 DIAGNOSIS — M5442 Lumbago with sciatica, left side: Secondary | ICD-10-CM | POA: Diagnosis not present

## 2020-12-07 DIAGNOSIS — M6281 Muscle weakness (generalized): Secondary | ICD-10-CM | POA: Diagnosis not present

## 2020-12-14 DIAGNOSIS — M5451 Vertebrogenic low back pain: Secondary | ICD-10-CM | POA: Diagnosis not present

## 2020-12-14 DIAGNOSIS — M6281 Muscle weakness (generalized): Secondary | ICD-10-CM | POA: Diagnosis not present

## 2020-12-18 ENCOUNTER — Encounter: Payer: Self-pay | Admitting: Neurology

## 2020-12-18 ENCOUNTER — Other Ambulatory Visit: Payer: Self-pay

## 2020-12-18 ENCOUNTER — Ambulatory Visit (INDEPENDENT_AMBULATORY_CARE_PROVIDER_SITE_OTHER): Payer: BC Managed Care – PPO | Admitting: Neurology

## 2020-12-18 VITALS — BP 121/77 | HR 69 | Ht 68.0 in | Wt 225.0 lb

## 2020-12-18 DIAGNOSIS — F411 Generalized anxiety disorder: Secondary | ICD-10-CM | POA: Diagnosis not present

## 2020-12-18 DIAGNOSIS — F5105 Insomnia due to other mental disorder: Secondary | ICD-10-CM | POA: Diagnosis not present

## 2020-12-18 DIAGNOSIS — Z9989 Dependence on other enabling machines and devices: Secondary | ICD-10-CM

## 2020-12-18 DIAGNOSIS — E669 Obesity, unspecified: Secondary | ICD-10-CM

## 2020-12-18 DIAGNOSIS — E119 Type 2 diabetes mellitus without complications: Secondary | ICD-10-CM

## 2020-12-18 DIAGNOSIS — G4733 Obstructive sleep apnea (adult) (pediatric): Secondary | ICD-10-CM | POA: Diagnosis not present

## 2020-12-18 DIAGNOSIS — E66811 Obesity, class 1: Secondary | ICD-10-CM | POA: Insufficient documentation

## 2020-12-18 MED ORDER — TRAZODONE HCL 50 MG PO TABS: 25.0000 mg | ORAL_TABLET | Freq: Every evening | ORAL | 0 refills | Status: DC | PRN

## 2020-12-18 NOTE — Patient Instructions (Addendum)
Quality Sleep Information, Adult Quality sleep is important for your mental and physical health. It also improves your quality of life. Quality sleep means you: Are asleep for most of the time you are in bed. Fall asleep within 30 minutes. Wake up no more than once a night.  Are awake for no longer than 20 minutes if you do wake up during the night. Most adults need 7-8 hours of quality sleep each night. How can poor sleep affect me? If you do not get enough quality sleep, you may have: Mood swings. Daytime sleepiness. Confusion. Decreased reaction time. Sleep disorders, such as insomnia and sleep apnea. Difficulty with: Solving problems. Coping with stress. Paying attention. These issues may affect your performance and productivity at work, school, and at home. Lack of sleep may also put you at higher risk for accidents, suicide, and risky behaviors. If you do not get quality sleep you may also be at higher risk for several health problems, including: Infections. Type 2 diabetes. Heart disease. High blood pressure. Obesity. Worsening of long-term conditions, like arthritis, kidney disease, depression, Parkinson's disease, and epilepsy. What actions can I take to get more quality sleep?   Stick to a sleep schedule. Go to sleep and wake up at about the same time each day. Do not try to sleep less on weekdays and make up for lost sleep on weekends. This does not work. Try to get about 30 minutes of exercise on most days. Do not exercise 2-3 hours before going to bed. Limit naps during the day to 30 minutes or less. Do not use any products that contain nicotine or tobacco, such as cigarettes or e-cigarettes. If you need help quitting, ask your health care provider. Do not drink caffeinated beverages for at least 8 hours before going to bed. Coffee, tea, and some sodas contain caffeine. Do not drink alcohol close to bedtime. Do not eat large meals close to bedtime. Do not take naps in  the late afternoon. Try to get at least 30 minutes of sunlight every day. Morning sunlight is best. Make time to relax before bed. Reading, listening to music, or taking a hot bath promotes quality sleep. Make your bedroom a place that promotes quality sleep. Keep your bedroom dark, quiet, and at a comfortable room temperature. Make sure your bed is comfortable. Take out sleep distractions like TV, a computer, smartphone, and bright lights. If you are lying awake in bed for longer than 20 minutes, get up and do a relaxing activity until you feel sleepy. Work with your health care provider to treat medical conditions that may affect sleeping, such as: Nasal obstruction. Snoring. Sleep apnea and other sleep disorders. Talk to your health care provider if you think any of your prescription medicines may cause you to have difficulty falling or staying asleep. If you have sleep problems, talk with a sleep consultant. If you think you have a sleep disorder, talk with your health care provider about getting evaluated by a specialist. Where to find more information National Sleep Foundation website: https://sleepfoundation.org National Heart, Lung, and Blood Institute (NHLBI): www.nhlbi.nih.gov/files/docs/public/sleep/healthy_sleep.pdf Centers for Disease Control and Prevention (CDC): www.cdc.gov/sleep/index.html Contact a health care provider if you: Have trouble getting to sleep or staying asleep. Often wake up very early in the morning and cannot get back to sleep. Have daytime sleepiness. Have daytime sleep attacks of suddenly falling asleep and sudden muscle weakness (narcolepsy). Have a tingling sensation in your legs with a strong urge to move your legs (restless   legs syndrome). Stop breathing briefly during sleep (sleep apnea). Think you have a sleep disorder or are taking a medicine that is affecting your quality of sleep. Summary Most adults need 7-8 hours of quality sleep each  night. Getting enough quality sleep is an important part of health and well-being. Make your bedroom a place that promotes quality sleep and avoid things that may cause you to have poor sleep, such as alcohol, caffeine, smoking, and large meals. Talk to your health care provider if you have trouble falling asleep or staying asleep. This information is not intended to replace advice given to you by your health care provider. Make sure you discuss any questions you have with your health care provider. Document Revised: 04/26/2017 Document Reviewed: 04/26/2017 Elsevier Patient Education  [place]. Trazodone Tablets What is this medication? TRAZODONE (TRAZ oh done) treats depression. It increases the amount of serotonin in the brain, a hormone that helps regulate mood. This medicine may be used for other purposes; ask your health care provider or pharmacist if you have questions. COMMON BRAND NAME(S): Desyrel What should I tell my care team before I take this medication? They need to know if you have any of these conditions: Attempted suicide or thinking about it Bipolar disorder Bleeding problems Glaucoma Heart disease, or previous heart attack Irregular heart beat Kidney or liver disease Low levels of sodium in the blood An unusual or allergic reaction to trazodone, other medications, foods, dyes or preservatives Pregnant or trying to get pregnant Breast-feeding How should I use this medication? Take this medication by mouth with a glass of water. Follow the directions on the prescription label. Take this medication shortly after a meal or a light snack. Take your medication at regular intervals. Do not take your medication more often than directed. Do not stop taking this medication suddenly except upon the advice of your care team. Stopping this medication too quickly may cause serious side effects or your condition may worsen. A special MedGuide will be given to you by the  pharmacist with each prescription and refill. Be sure to read this information carefully each time. Talk to your care team regarding the use of this medication in children. Special care may be needed. Overdosage: If you think you have taken too much of this medicine contact a poison control center or emergency room at once. NOTE: This medicine is only for you. Do not share this medicine with others. What if I miss a dose? If you miss a dose, take it as soon as you can. If it is almost time for your next dose, take only that dose. Do not take double or extra doses. What may interact with this medication? Do not take this medication with any of the following: Certain medications for fungal infections like fluconazole, itraconazole, ketoconazole, posaconazole, voriconazole Cisapride Dronedarone Linezolid MAOIs like Carbex, Eldepryl, Marplan, Nardil, and Parnate Mesoridazine Methylene blue (injected into a vein) Pimozide Saquinavir Thioridazine This medication may also interact with the following: Alcohol Antiviral medications for HIV or AIDS Aspirin and aspirin-like medications Barbiturates like phenobarbital Certain medications for blood pressure, heart disease, irregular heart beat Certain medications for depression, anxiety, or psychotic disturbances Certain medications for migraine headache like almotriptan, eletriptan, frovatriptan, naratriptan, rizatriptan, sumatriptan, zolmitriptan Certain medications for seizures like carbamazepine and phenytoin Certain medications for sleep Certain medications that treat or prevent blood clots like dalteparin, enoxaparin, warfarin Digoxin Fentanyl Lithium NSAIDS, medications for pain and inflammation, like ibuprofen or naproxen Other medications that prolong the  QT interval (cause an abnormal heart rhythm) like dofetilide Rasagiline Supplements like St. John's wort, kava kava, valerian Tramadol Tryptophan This list may not describe all  possible interactions. Give your health care provider a list of all the medicines, herbs, non-prescription drugs, or dietary supplements you use. Also tell them if you smoke, drink alcohol, or use illegal drugs. Some items may interact with your medicine. What should I watch for while using this medication? Tell your care team if your symptoms do not get better or if they get worse. Visit your care team for regular checks on your progress. Because it may take several weeks to see the full effects of this medication, it is important to continue your treatment as prescribed by your care team. Watch for new or worsening thoughts of suicide or depression. This includes sudden changes in mood, behaviors, or thoughts. These changes can happen at any time but are more common in the beginning of treatment or after a change in dose. Call your care team right away if you experience these thoughts or worsening depression. Manic episodes may happen in patients with bipolar disorder who take this medication. Watch for changes in feelings or behaviors such as feeling anxious, nervous, agitated, panicky, irritable, hostile, aggressive, impulsive, severely restless, overly excited and hyperactive, or trouble sleeping. These changes can happen at any time but are more common in the beginning of treatment or after a change in dose. Call your care team right away if you notice any of these symptoms. You may get drowsy or dizzy. Do not drive, use machinery, or do anything that needs mental alertness until you know how this medication affects you. Do not stand or sit up quickly, especially if you are an older patient. This reduces the risk of dizzy or fainting spells. Alcohol may interfere with the effect of this medication. Avoid alcoholic drinks. This medication may cause dry eyes and blurred vision. If you wear contact lenses you may feel some discomfort. Lubricating drops may help. See your eye doctor if the problem does not  go away or is severe. Your mouth may get dry. Chewing sugarless gum, sucking hard candy and drinking plenty of water may help. Contact your care team if the problem does not go away or is severe. What side effects may I notice from receiving this medication? Side effects that you should report to your care team as soon as possible: Allergic reactions--skin rash, itching, hives, swelling of the face, lips, tongue, or throat Bleeding--bloody or black, tar-like stools, red or dark brown urine, vomiting blood or brown material that looks like coffee grounds, small, red or purple spots on skin, unusual bleeding or bruising Heart rhythm changes--fast or irregular heartbeat, dizziness, feeling faint or lightheaded, chest pain, trouble breathing Low blood pressure--dizziness, feeling faint or lightheaded, blurry vision Low sodium level--muscle weakness, fatigue, dizziness, headache, confusion Prolonged or painful erection Serotonin syndrome--irritability, confusion, fast or irregular heartbeat, muscle stiffness, twitching muscles, sweating, high fever, seizures, chills, vomiting, diarrhea Sudden eye pain or change in vision such as blurry vision, seeing halos around lights, vision loss Thoughts of suicide or self-harm, worsening mood, feelings of depression Side effects that usually do not require medical attention (report to your care team if they continue or are bothersome): Change in sex drive or performance Constipation Dizziness Drowsiness Dry mouth This list may not describe all possible side effects. Call your doctor for medical advice about side effects. You may report side effects to FDA at 1-800-FDA-1088. Where should I  keep my medication? Keep out of the reach of children and pets. Store at room temperature between 15 and 30 degrees C (59 to 86 degrees F). Protect from light. Keep container tightly closed. Throw away any unused medication after the expiration date. NOTE: This sheet is a  summary. It may not cover all possible information. If you have questions about this medicine, talk to your doctor, pharmacist, or health care provider.  2022 Elsevier/Gold Standard (2020-01-08 00:00:00)

## 2020-12-18 NOTE — Progress Notes (Signed)
SLEEP MEDICINE CLINIC    Provider:  Melvyn Novas, MD  Primary Care Physician:  Tamera Stands, DO (Inactive) No address on file     Referring Provider: Dow Adolph, Fnp [address] [place],  [place] [address]          Chief Complaint according to patient   Patient presents with:     New Patient (Initial Visit)           HISTORY OF PRESENT ILLNESS:  Amanda Holt is a 50 y.o. -Caucasian female patient was seen here in a sleep consultation  on 12/18/2020 from PCP .  Chief concern according to patient :   Pt has a soon 48 year old CPAP. She had a SS in [place] after being dx with atrial fib. About 2012, and moved here in Jan 22, her refills have expired from previous provider and she is needing to establish locally.  Set up date of [facility] 04/08/16, she had a last sleep study HST- before the new machine in 2017.    Elvis Coil  has a past medical history of Anemia,  atrial fibrillation, Atrial flutter (HCC), SVT, ablation, diverticulitis of colon, OSA and on CPAP for a decade, and prediabetes, Hypoglycemia.   CPAP dependent.  Loud snoring. Severe apnea.    Sleep relevant medical history: Nocturia  before CPAP, still 2 a night, sleep walker in her youth,  no ENT, no thyroid disease.   Family medical /sleep history: cousins  on CPAP with OSA, sons with sleep walking.    Social history: patient is in school.  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   The patient currently works in an office- 6 AM - 3 Pm, works from home .  Tobacco use- none , quit 15 years ago. Smoked since age 66.   ETOH use - rare ,  Caffeine intake only decaffeinated.  Regular exercise in form of 3-4 times a week- home. .   Hobbies : writing, painting, making soaps.       Sleep habits are as follows: The patient's dinner time is between 4.30 - 6.30 PM. The patient goes to bed at 8 PM and  continues to sleep for 6 hours of interrupted sleep, wakes for other than  bathroom breaks, the first time at 12 AM.   The preferred sleep position is left side, with the support of 2 pillows.  Dreams are reportedly frequent/vivid- 3-5 dreams a night.Marland Kitchen  4-5  AM is the usual rise time. The patient wakes up spontaneously at 3.30 AM, when husband leaves for work..  She reports not feeling refreshed or restored in AM, with symptoms such as dry mouth, morning headaches, and residual fatigue.  Naps are taken infrequently, she fights naps- feels worse- lasting from 60-120  minutes and are less refreshing than nocturnal sleep.    Review of Systems: Out of a complete 14 system review, the patient complains of only the following symptoms, and all other reviewed systems are negative.:  Fatigue, sleepiness , snoring, fragmented sleep, Insomnia - broken sleep, vivid dreams.    How likely are you to doze in the following situations: 0 = not likely, 1 = slight chance, 2 = moderate chance, 3 = high chance   Sitting and Reading? Watching Television? Sitting inactive in a public place (theater or meeting)? As a passenger in  a car for an hour without a break? Lying down in the afternoon when circumstances permit? Sitting and talking to someone? Sitting quietly after lunch without alcohol? In a car, while stopped for a few minutes in traffic?   Total = 10/ 24 points   FSS endorsed at 45/ 63 points.   Social History   Socioeconomic History   Marital status: Married    Spouse name: Not on file   Number of children: Not on file   Years of education: Not on file   Highest education level: Not on file  Occupational History   Not on file  Tobacco Use   Smoking status: Former   Smokeless tobacco: Never  Vaping Use   Vaping Use: Never used  Substance and Sexual Activity   Alcohol use: Yes    Comment: occasional   Drug use: No   Sexual activity: Yes  Other Topics Concern   Not on file  Social  History Narrative   Not on file   Social Determinants of Health   Financial Resource Strain: Not on file  Food Insecurity: Not on file  Transportation Needs: Not on file  Physical Activity: Not on file  Stress: Not on file  Social Connections: Not on file    Family History  Problem Relation Age of Onset   Breast cancer Paternal Aunt 4   Breast cancer Paternal Grandmother 35    Past Medical History:  Diagnosis Date   Anemia    Atrial flutter (HCC)    Hx of diverticulitis of colon    Hypoglycemia     Past Surgical History:  Procedure Laterality Date   APPENDECTOMY     cesarian section     eblation       Current Outpatient Medications on File Prior to Visit  Medication Sig Dispense Refill   chlorthalidone (HYGROTON) 25 MG tablet Take 12.5 mg by mouth daily.  5   ECHINACEA PO Take 1 capsule by mouth daily as needed (for cold symptoms.).     ibuprofen (ADVIL,MOTRIN) 400 MG tablet Take 400 mg by mouth every 6 (six) hours as needed for headache or mild pain.      lidocaine (XYLOCAINE) 2 % solution Use as directed 15 mLs in the mouth or throat every 6 (six) hours as needed (to apply rectally for pain as needed). 100 mL 0   meclizine (ANTIVERT) 25 MG tablet Take 1 tablet (25 mg total) by mouth 3 (three) times daily as needed for dizziness. 12 tablet 0   metroNIDAZOLE (FLAGYL) 500 MG tablet Take 1 tablet (500 mg total) by mouth 3 (three) times daily. 30 tablet 0   norethindrone (MICRONOR,CAMILA,ERRIN) 0.35 MG tablet Take 1 tablet by mouth daily.  3   ondansetron (ZOFRAN ODT) 4 MG disintegrating tablet Take 1 tablet (4 mg total) by mouth every 8 (eight) hours as needed for nausea or vomiting. 12 tablet 0   ondansetron (ZOFRAN) 4 MG tablet Take 1 tablet (4 mg total) by mouth every 8 (eight) hours as needed for nausea or vomiting. 10 tablet 0   Probiotic Product (PROBIOTIC ACIDOPHILUS) CAPS Take 1 capsule by mouth daily.     promethazine (PHENERGAN) 12.5 MG tablet Take 2 tablets (25  mg total) by mouth every 6 (six) hours as needed for nausea or vomiting. 20 tablet 0   sucralfate (CARAFATE) 1 g tablet Take 1 tablet (1 g total) by mouth 4 (four) times daily as needed (for abdominal discomfort, nausea, and/or vomiting). 30 tablet 1  vitamin C (ASCORBIC ACID) 500 MG tablet Take 500 mg by mouth daily.     No current facility-administered medications on file prior to visit.    Allergies  Allergen Reactions   Barbiturates Nausea And Vomiting and Other (See Comments)    Patient states this medication makes her pass out.   Codeine Nausea And Vomiting    Physical exam:  Today's Vitals   12/18/20 1146  BP: 121/77  Pulse: 69  Weight: 225 lb (102.1 kg)  Height: 5\' 8"  (1.727 m)   Body mass index is 34.21 kg/m.   Wt Readings from Last 3 Encounters:  12/18/20 225 lb (102.1 kg)  11/03/17 215 lb (97.5 kg)  08/29/17 208 lb (94.3 kg)     Ht Readings from Last 3 Encounters:  12/18/20 5\' 8"  (1.727 m)  11/03/17 5\' 8"  (1.727 m)  08/29/17 5\' 8"  (1.727 m)      General: The patient is awake, alert and appears not in acute distress. The patient is well groomed. Head: Normocephalic, atraumatic. Neck is supple. Mallampati 2-3 , dentures. ,  neck circumference:16 inches . Nasal airflow patent.  Retrognathia is  seen.  Dental status: dentures.  Cardiovascular:  Regular rate and cardiac rhythm by pulse,  without distended neck veins. Respiratory: Lungs are clear to auscultation.  Skin:  Without evidence of ankle edema, or rash. Trunk: The patient's posture is erect.   Neurologic exam : The patient is awake and alert, oriented to place and time.   Memory subjective described as intact.  Attention span & concentration ability appears normal.  Speech is fluent,  without  dysarthria, dysphonia or aphasia.  Mood and affect are appropriate.   Cranial nerves: no loss of smell or taste reported  Pupils are equal and briskly reactive to light. Funduscopic exam deferred.   Extraocular movements in vertical and horizontal planes were intact and without nystagmus. No Diplopia. Visual fields by finger perimetry are intact. Hearing was intact to soft voice and tuning fork, non- lateralizing. Right ear feeling of fullness.   Facial sensation intact to fine touch.  Facial motor strength is symmetric and tongue and uvula move midline.  Neck ROM : rotation, tilt and flexion extension were normal for age and shoulder shrug was symmetrical.    Motor exam:  Symmetric bulk, tone and ROM.   Normal tone without cog wheeling, symmetric grip strength .   Sensory:  vibration decreased in right ankles.  Proprioception tested in the upper extremities was normal.   Coordination: Rapid alternating movements in the fingers/hands were of normal speed.  The Finger-to-nose maneuver was intact without evidence of ataxia, dysmetria or tremor.   Gait and station: Patient could rise unassisted from a seated position, walked without assistive device.  Stance is of normal width/ base and the patient turned with 3 steps.( RN report)   Toe and heel walk were deferred.  Deep tendon reflexes: in the  upper and lower extremities are symmetric and intact.  Babinski response was deferred .   The current auto titration device shows 100% compliance for days and hours with an average time of 8 hours and 1 minute each night.  This is an AutoSet at factory settings between 4 and 20 cmH2O and without any setting for expiratory pressure relief.  The patient's 95th percentile pressure is at 8.1 cmH2O so there is no high pressure needed to overcome her apnea.  Residual AHI is excellent at 0.6/h.  There are equal amounts of central and obstructive events.  She does not have high air leakage the 95th percentile air leak is 4 L a minute.  She is using a nasal cradle mask likely a model by [employer].  The needs a new water reservoir and heated tubing.  After spending a total time of  45   minutes face to face and additional time for physical and neurologic examination, review of laboratory studies,  personal review of imaging studies, reports and results of other testing and review of referral information / records as far as provided in visit, I have established the following assessments:  1) well controled apnea-  OSA was last confirmed 5 years ago, we will use a HST to confirm the diagnosis and to prescribe the needed new RESMED machine,  2) history of increased sleepiness persistent.     My Plan is to proceed with:  1)repeat HST.  2)prescribe new machine.  3) chornic anxiety related insomnia =consider trazodone and/ or melatonin for better sleep   I would like to thank Tamera Stands, DO (Inactive) and Dow Adolph, Fnp [address] [place],  [place] [address] for allowing me to meet with and to take care of this pleasant patient.     I plan to follow up either personally or through our NP within 2-4 month.   CC: I will share my notes with PCP.  Electronically signed by: Melvyn Novas, MD 12/18/2020 12:08 PM  [facility] Board certified by The [employer] of Sleep Medicine and Diplomate of the [employer] of Sleep Medicine. Board certified In Neurology through the ABPN, Fellow of the [employer] of Neurology. Medical Director of [facility].

## 2020-12-21 ENCOUNTER — Telehealth: Payer: Self-pay | Admitting: Neurology

## 2020-12-21 DIAGNOSIS — Z9989 Dependence on other enabling machines and devices: Secondary | ICD-10-CM

## 2020-12-21 DIAGNOSIS — F5105 Insomnia due to other mental disorder: Secondary | ICD-10-CM

## 2020-12-21 DIAGNOSIS — G4733 Obstructive sleep apnea (adult) (pediatric): Secondary | ICD-10-CM

## 2020-12-21 DIAGNOSIS — F411 Generalized anxiety disorder: Secondary | ICD-10-CM

## 2020-12-21 DIAGNOSIS — E66811 Obesity, class 1: Secondary | ICD-10-CM

## 2020-12-21 DIAGNOSIS — E669 Obesity, unspecified: Secondary | ICD-10-CM

## 2020-12-21 DIAGNOSIS — M6281 Muscle weakness (generalized): Secondary | ICD-10-CM | POA: Diagnosis not present

## 2020-12-21 DIAGNOSIS — M5442 Lumbago with sciatica, left side: Secondary | ICD-10-CM | POA: Diagnosis not present

## 2020-12-21 NOTE — Telephone Encounter (Signed)
Called the patient back and advised that we went ahead and scheduled a 3 mth follow up as this would be needed if she starts a new machine. Pt is not eligible for a new machine until 03/2021. Advised most likely the apt date will need to be pushed out until she is due for a initial cpap apt. Pt is in need of supplies between now and that time. Advised that that we can send orders to the [facility] home specialist that was placed on file. Pt was appreciative for that and had no other questions at this time. States she will await to complete the HST.

## 2020-12-21 NOTE — Addendum Note (Signed)
Addended by: Judi Cong on: 12/21/2020 08:59 AM   Modules accepted: Orders

## 2020-12-21 NOTE — Telephone Encounter (Signed)
Pt called has some questions about her 3 month follow up with the NP. She wants to know what this appt will be for. Pt requesting a call back.

## 2020-12-28 DIAGNOSIS — M5442 Lumbago with sciatica, left side: Secondary | ICD-10-CM | POA: Diagnosis not present

## 2020-12-28 DIAGNOSIS — M6281 Muscle weakness (generalized): Secondary | ICD-10-CM | POA: Diagnosis not present

## 2020-12-30 NOTE — Telephone Encounter (Signed)
I had a different fax number. I have resent the orders to the fax number that was provided the patient. I will add this fax number to our snapshot for future orders.

## 2020-12-30 NOTE — Telephone Encounter (Signed)
Pt called states [facility] home specialist still have not received orders for supplies. Pt provided a new fax number for them [phone].

## 2021-01-04 ENCOUNTER — Emergency Department (HOSPITAL_COMMUNITY): Payer: BC Managed Care – PPO

## 2021-01-04 ENCOUNTER — Encounter (HOSPITAL_COMMUNITY): Payer: Self-pay

## 2021-01-04 ENCOUNTER — Other Ambulatory Visit: Payer: Self-pay

## 2021-01-04 ENCOUNTER — Emergency Department (HOSPITAL_COMMUNITY)
Admission: EM | Admit: 2021-01-04 | Discharge: 2021-01-05 | Disposition: A | Discharge status: Home / Self Care | Payer: BC Managed Care – PPO | Attending: Emergency Medicine | Admitting: Emergency Medicine

## 2021-01-04 DIAGNOSIS — J329 Chronic sinusitis, unspecified: Secondary | ICD-10-CM | POA: Diagnosis not present

## 2021-01-04 DIAGNOSIS — J069 Acute upper respiratory infection, unspecified: Secondary | ICD-10-CM | POA: Diagnosis not present

## 2021-01-04 DIAGNOSIS — M7918 Myalgia, other site: Secondary | ICD-10-CM

## 2021-01-04 DIAGNOSIS — R519 Headache, unspecified: Secondary | ICD-10-CM | POA: Insufficient documentation

## 2021-01-04 DIAGNOSIS — M25512 Pain in left shoulder: Secondary | ICD-10-CM | POA: Diagnosis not present

## 2021-01-04 DIAGNOSIS — Z87891 Personal history of nicotine dependence: Secondary | ICD-10-CM | POA: Insufficient documentation

## 2021-01-04 DIAGNOSIS — E119 Type 2 diabetes mellitus without complications: Secondary | ICD-10-CM | POA: Insufficient documentation

## 2021-01-04 DIAGNOSIS — M542 Cervicalgia: Secondary | ICD-10-CM | POA: Insufficient documentation

## 2021-01-04 DIAGNOSIS — R079 Chest pain, unspecified: Secondary | ICD-10-CM | POA: Diagnosis not present

## 2021-01-04 DIAGNOSIS — R9431 Abnormal electrocardiogram [ECG] [EKG]: Secondary | ICD-10-CM | POA: Diagnosis not present

## 2021-01-04 DIAGNOSIS — B9689 Other specified bacterial agents as the cause of diseases classified elsewhere: Secondary | ICD-10-CM | POA: Diagnosis not present

## 2021-01-04 DIAGNOSIS — E282 Polycystic ovarian syndrome: Secondary | ICD-10-CM | POA: Diagnosis not present

## 2021-01-04 LAB — TROPONIN I (HIGH SENSITIVITY): Troponin I (High Sensitivity): <2 ng/L (ref <18)

## 2021-01-04 LAB — CBC WITH DIFFERENTIAL/PLATELET
Abs Immature Granulocytes: 0.02 10*3/uL (ref 0.00–0.07)
Basophils Absolute: 0.1 10*3/uL (ref 0.0–0.1)
Basophils Relative: 1 %
Eosinophils Absolute: 0.2 10*3/uL (ref 0.0–0.5)
Eosinophils Relative: 2 %
HCT: 40.3 % (ref 36.0–46.0)
Hemoglobin: 13 g/dL (ref 12.0–15.0)
Immature Granulocytes: 0 %
Lymphocytes Relative: 27 %
Lymphs Abs: 2 10*3/uL (ref 0.7–4.0)
MCH: 28 pg (ref 26.0–34.0)
MCHC: 32.3 g/dL (ref 30.0–36.0)
MCV: 86.7 fL (ref 80.0–100.0)
Monocytes Absolute: 0.9 10*3/uL (ref 0.1–1.0)
Monocytes Relative: 12 %
Neutro Abs: 4.4 10*3/uL (ref 1.7–7.7)
Neutrophils Relative %: 58 %
Platelets: 352 10*3/uL (ref 150–400)
RBC: 4.65 MIL/uL (ref 3.87–5.11)
RDW: 14.4 % (ref 11.5–15.5)
WBC: 7.6 10*3/uL (ref 4.0–10.5)
nRBC: 0 % (ref 0.0–0.2)

## 2021-01-04 LAB — COMPREHENSIVE METABOLIC PANEL
ALT: 21 U/L (ref 0–44)
AST: 18 U/L (ref 15–41)
Albumin: 4.2 g/dL (ref 3.5–5.0)
Alkaline Phosphatase: 64 U/L (ref 38–126)
Anion gap: 10 (ref 5–15)
BUN: 13 mg/dL (ref 6–20)
CO2: 28 mmol/L (ref 22–32)
Calcium: 9.6 mg/dL (ref 8.9–10.3)
Chloride: 100 mmol/L (ref 98–111)
Creatinine, Ser: 0.64 mg/dL (ref 0.44–1.00)
GFR, Estimated: >60 mL/min (ref >60)
Glucose, Bld: 170 mg/dL — ABNORMAL HIGH (ref 70–99)
Potassium: 3.3 mmol/L — ABNORMAL LOW (ref 3.5–5.1)
Sodium: 138 mmol/L (ref 135–145)
Total Bilirubin: 0.6 mg/dL (ref 0.3–1.2)
Total Protein: 7.5 g/dL (ref 6.5–8.1)

## 2021-01-04 NOTE — ED Triage Notes (Addendum)
Pt reports with left shoulder pain that started today and radiates to the left side of her head. Pt describes the pain as shooting and sharp.

## 2021-01-04 NOTE — ED Provider Notes (Signed)
Emergency Medicine Provider Triage Evaluation Note  Amanda Holt , a 50 y.o. female  was evaluated in triage.  Pt complains of sharp pain in the left posterior shoulder.  She reports that it shoots up across the top of her head.  She reports that she has had been  going into and out of a-fib.    No new back pain.  No weakness.   No chiropractic manipulations she states that when she woke up she felt like it was a crick in her neck.   She does report multiple life stressors.   Review of Systems  Positive: Left shoulder and neck/head pain Negative: weakness  Physical Exam  BP (!) 168/99 (BP Location: Left Arm)   Pulse 92   Temp 97.7 F (36.5 C) (Oral)   Resp 16   SpO2 96%  Gen:   Awake, tearful, anxious Resp:  Normal effort  MSK:   Moves extremities without difficulty  Other:  Patient with subjective decrease sensation over the left-sided head, including the scalp, all 3 facial distributions, and the neck.  Smile is symmetric.  Speech is not slurred.  No aphasia.  5/5 strength bilateral upper and lower extremities.  There is tenderness to palpation over the left trapezius muscle.  2+ radial pulses bilaterally.  2+ DP pulses bilaterally.  Medical Decision Making  Medically screening exam initiated at 8:51 PM.  Appropriate orders placed.  SABEEN PIECHOCKI was informed that the remainder of the evaluation will be completed by another provider, this initial triage assessment does not replace that evaluation, and the importance of remaining in the ED until their evaluation is complete.  Patient does not have any acute neurodeficits on exam other than subjective decrease sensation over the entire left side of her head and the left-sided neck.  This has been ongoing since this morning.  Low suspicion for stroke at this time, however given her hypertension will obtain CT head.  Additionally will obtain labs and chest x-ray and EKG for her fluttering in her chest.  Note: Portions of this report  may have been transcribed using voice recognition software. Every effort was made to ensure accuracy; however, inadvertent computerized transcription errors may be present    Norman Clay 01/04/21 2113    Charlynne Pander, MD 01/04/21 2322

## 2021-01-05 MED ORDER — IBUPROFEN 200 MG PO TABS
600.0000 mg | ORAL_TABLET | Freq: Once | ORAL | Status: AC
  Administered 2021-01-05: 600 mg via ORAL
  Filled 2021-01-05: qty 3

## 2021-01-05 MED ORDER — IBUPROFEN 600 MG PO TABS: 600.0000 mg | ORAL_TABLET | Freq: Four times a day (QID) | ORAL | 0 refills | Status: DC | PRN

## 2021-01-05 MED ORDER — METHOCARBAMOL 500 MG PO TABS: 500.0000 mg | ORAL_TABLET | Freq: Two times a day (BID) | ORAL | 0 refills | Status: DC

## 2021-01-05 MED ORDER — METHOCARBAMOL 500 MG PO TABS
750.0000 mg | ORAL_TABLET | Freq: Once | ORAL | Status: AC
  Administered 2021-01-05: 750 mg via ORAL
  Filled 2021-01-05: qty 2

## 2021-01-05 NOTE — Discharge Instructions (Signed)
Take medications as prescribed. Follow up with your doctor for recheck to ensure improvement in 3 days. Warm compresses to the sore area for additional relief.

## 2021-01-05 NOTE — ED Provider Notes (Signed)
[facility]EMERGENCY DEPT Provider Note   CSN: [id number] Arrival date & time: 01/04/21  2032     History Chief Complaint  Patient presents with   Headache   Shoulder Pain    Amanda Holt is a 50 y.o. female.  Patient to ED for evaluation of pain in her left shoulder and left lateral neck that radiates into her scalp. Pain started yesterday. No known injury. No swelling or fever. The pain is worse with movement, especially turning her head and swallowing. No difficulty swallowing or sore throat. She feels her heart rate elevate periodically and reports history of atrial fibrillation. No chest pain, SOB, cough, fever, cervical pain.   The history is provided by the patient. No language interpreter was used.  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/2020   Generalized anxiety disorder 12/18/2020   OSA on CPAP 12/18/2020   Dependence on CPAP ventilation 12/18/2020   Obesity (BMI 30.0-34.9) 12/18/2020   Type 2 diabetes mellitus without complication, without long-term current use of insulin (HCC) 12/18/2020    Past Surgical History:  Procedure Laterality Date   APPENDECTOMY     cesarian section     eblation       OB History     Gravida  4   Para      Term      Preterm      AB      Living         SAB      IAB      Ectopic      Multiple      Live Births              Family History  Problem Relation Age of Onset   Breast cancer Paternal Aunt 49   Breast cancer Paternal Grandmother 78     Social History   Tobacco Use   Smoking status: Former   Smokeless tobacco: Never  Building services engineer Use: Never used  Substance Use Topics   Alcohol use: Yes    Comment: occasional   Drug use: No    Home Medications Prior to Admission medications   Medication Sig Start Date End Date Taking? Authorizing Provider  chlorthalidone (HYGROTON) 25 MG tablet Take 12.5 mg by mouth daily. 10/27/17   [provider]  ECHINACEA PO Take 1 capsule by mouth daily as needed (for cold symptoms.).    [provider]  ibuprofen (ADVIL,MOTRIN) 400 MG tablet Take 400 mg by mouth every 6 (six) hours as needed for headache or mild pain.     [provider]  lidocaine (XYLOCAINE) 2 % solution Use as directed 15 mLs in the mouth or throat every 6 (six) hours as needed (to apply rectally for pain as needed). 08/29/17   Emily Filbert, MD  meclizine (ANTIVERT) 25 MG tablet Take 1 tablet (25 mg total) by mouth 3 (three) times daily as needed for dizziness.  05/22/20   Tilden Fossa, MD  metroNIDAZOLE (FLAGYL) 500 MG tablet Take 1 tablet (500 mg total) by mouth 3 (three) times daily. 08/13/14   Sharyn Creamer, MD  norethindrone (MICRONOR,CAMILA,ERRIN) 0.35 MG tablet Take 1 tablet by mouth daily. 10/27/17   [provider]  ondansetron (ZOFRAN ODT) 4 MG disintegrating tablet Take 1 tablet (4 mg total) by mouth every 8 (eight) hours as needed for nausea or vomiting. 05/22/20   Tilden Fossa, MD  ondansetron (ZOFRAN) 4 MG tablet Take 1 tablet (4 mg total) by mouth every 8 (eight) hours as needed for nausea or vomiting. 11/03/17   Governor Rooks, MD  Probiotic Product (PROBIOTIC ACIDOPHILUS) CAPS Take 1 capsule by mouth daily.    [provider]  promethazine (PHENERGAN) 12.5 MG tablet Take 2 tablets (25 mg total) by mouth every 6 (six) hours as needed for nausea or vomiting. 11/03/17   Governor Rooks, MD  sucralfate (CARAFATE) 1 g tablet Take 1 tablet (1 g total) by mouth 4  (four) times daily as needed (for abdominal discomfort, nausea, and/or vomiting). 11/03/17   Loleta Rose, MD  traZODone (DESYREL) 50 MG tablet Take 0.5 tablets (25 mg total) by mouth at bedtime as needed for sleep. 12/18/20   Dohmeier, Porfirio Mylar, MD  vitamin C (ASCORBIC ACID) 500 MG tablet Take 500 mg by mouth daily.    [provider]    Allergies    Barbiturates and Codeine  Review of Systems   Review of Systems  Constitutional:  Negative for fever.  HENT: Negative.  Negative for congestion, sore throat and trouble swallowing.   Respiratory:  Negative for cough and shortness of breath.   Cardiovascular:  Positive for palpitations. Negative for chest pain.  Gastrointestinal:  Negative for abdominal pain, nausea and vomiting.  Musculoskeletal:  Negative for back pain and neck pain.       See HPI.  Skin:  Negative for color change and wound.  Neurological:  Positive for headaches. Negative for weakness and numbness.   Physical Exam Updated Vital Signs BP (!) 172/96   Pulse 90   Temp 97.7 F (36.5 C) (Oral)   Resp 18   LMP 12/14/2020 (Approximate)   SpO2 99%   Physical Exam Vitals and nursing note reviewed.  Constitutional:      General: She is not in acute distress.    Appearance: She is well-developed.  HENT:     Head: Normocephalic and atraumatic.  Neck:     Thyroid: No thyromegaly or thyroid tenderness.  Cardiovascular:     Rate and Rhythm: Normal rate and regular rhythm.     Heart sounds: No murmur heard. Pulmonary:     Effort: Pulmonary effort is normal.     Breath sounds: Normal breath sounds. No wheezing, rhonchi or rales.  Chest:     Chest wall: No tenderness.  Musculoskeletal:        General: Normal range of motion.       Arms:     Cervical back: Normal range of motion and neck supple. No rigidity or tenderness.     Comments: Tenderness to left lateral neck and trapezius. No swelling No midline cervical or paracervical tenderness. Full strength of  left UE with increased shoulder pain on extension. Distal pulse present.   Skin:    General: Skin is warm and dry.     Findings: No bruising or erythema.  Neurological:     Mental Status: She is alert and oriented to person, place, and time.  ED Results / Procedures / Treatments   Labs (all labs ordered are listed, but only abnormal results are displayed) Labs Reviewed  COMPREHENSIVE METABOLIC PANEL - Abnormal; Notable for the following components:      Result Value   Potassium 3.3 (*)    Glucose, Bld 170 (*)    All other components within normal limits  CBC WITH DIFFERENTIAL/PLATELET  TROPONIN I (HIGH SENSITIVITY)  TROPONIN I (HIGH SENSITIVITY)    EKG None  Radiology DG Chest 2 View  Result Date: 01/04/2021 CLINICAL DATA:  Left-sided chest pain EXAM: CHEST - 2 VIEW COMPARISON:  01/27/2012 FINDINGS: The heart size and mediastinal contours are within normal limits. Both lungs are clear. The visualized skeletal structures are unremarkable. IMPRESSION: No active cardiopulmonary disease. Electronically Signed   By: Alcide Clever M.D.   On: 01/04/2021 21:27   CT HEAD WO CONTRAST ( )  Result Date: 01/04/2021 CLINICAL DATA:  Headaches EXAM: CT HEAD WITHOUT CONTRAST TECHNIQUE: Contiguous axial images were obtained from the base of the skull through the vertex without intravenous contrast. COMPARISON:  05/22/2020 FINDINGS: Brain: No evidence of acute infarction, hemorrhage, hydrocephalus, extra-axial collection or mass lesion/mass effect. Vascular: No hyperdense vessel or unexpected calcification. Skull: Normal. Negative for fracture or focal lesion. Sinuses/Orbits: No acute finding. Other: None. IMPRESSION: No acute intracranial abnormality noted. Electronically Signed   By: Alcide Clever M.D.   On: 01/04/2021 21:28    Procedures Procedures   Medications Ordered in ED Medications - No data to display  ED Course  I have reviewed the triage vital signs and the nursing  notes.  Pertinent labs & imaging results that were available during my care of the patient were reviewed by me and considered in my medical decision making (see chart for details).    MDM Rules/Calculators/A&P                           Patient to ED with left lateral neck and left shoulder pain since last night.   She reports she feels she is going in and out of A-fib. NSR on EKG tonight, normal rate. Labs and imaging were obtained and reviewed by me. No concerning abnormalities.   The left superior shoulder line and left neck are reproducibly tender. No swelling or redness. Suspect musculoskeletal pain/spasm. Will treat supportively and recommend PCP follow up.   Final Clinical Impression(s) / ED Diagnoses Final diagnoses:  None   Left shoulder pain  Rx / DC Orders ED Discharge Orders     None        Elpidio Anis, PA-C 01/05/21 0038    Gilda Crease, MD 01/06/21 [phone]

## 2021-01-11 DIAGNOSIS — M5442 Lumbago with sciatica, left side: Secondary | ICD-10-CM | POA: Diagnosis not present

## 2021-01-11 DIAGNOSIS — M6281 Muscle weakness (generalized): Secondary | ICD-10-CM | POA: Diagnosis not present

## 2021-01-12 ENCOUNTER — Other Ambulatory Visit: Payer: Self-pay | Admitting: Family Medicine

## 2021-01-12 ENCOUNTER — Other Ambulatory Visit: Payer: Self-pay | Admitting: Nurse Practitioner

## 2021-01-12 DIAGNOSIS — N83209 Unspecified ovarian cyst, unspecified side: Secondary | ICD-10-CM

## 2021-01-12 DIAGNOSIS — E782 Mixed hyperlipidemia: Secondary | ICD-10-CM | POA: Diagnosis not present

## 2021-01-12 DIAGNOSIS — R102 Pelvic and perineal pain: Secondary | ICD-10-CM

## 2021-01-12 DIAGNOSIS — E1165 Type 2 diabetes mellitus with hyperglycemia: Secondary | ICD-10-CM | POA: Diagnosis not present

## 2021-01-14 DIAGNOSIS — E1165 Type 2 diabetes mellitus with hyperglycemia: Secondary | ICD-10-CM | POA: Diagnosis not present

## 2021-01-14 DIAGNOSIS — M25512 Pain in left shoulder: Secondary | ICD-10-CM | POA: Diagnosis not present

## 2021-01-14 DIAGNOSIS — E782 Mixed hyperlipidemia: Secondary | ICD-10-CM | POA: Diagnosis not present

## 2021-01-14 DIAGNOSIS — E669 Obesity, unspecified: Secondary | ICD-10-CM | POA: Diagnosis not present

## 2021-01-29 ENCOUNTER — Ambulatory Visit
Admission: RE | Admit: 2021-01-29 | Discharge: 2021-01-29 | Disposition: A | Discharge status: Home / Self Care | Payer: BC Managed Care – PPO | Source: Ambulatory Visit | Attending: Nurse Practitioner | Admitting: Nurse Practitioner

## 2021-01-29 DIAGNOSIS — D259 Leiomyoma of uterus, unspecified: Secondary | ICD-10-CM | POA: Diagnosis not present

## 2021-01-29 DIAGNOSIS — N83209 Unspecified ovarian cyst, unspecified side: Secondary | ICD-10-CM

## 2021-01-29 DIAGNOSIS — R102 Pelvic and perineal pain: Secondary | ICD-10-CM

## 2021-01-29 DIAGNOSIS — Z8742 Personal history of other diseases of the female genital tract: Secondary | ICD-10-CM | POA: Diagnosis not present

## 2021-02-09 DIAGNOSIS — I10 Essential (primary) hypertension: Secondary | ICD-10-CM | POA: Diagnosis not present

## 2021-02-09 DIAGNOSIS — E663 Overweight: Secondary | ICD-10-CM | POA: Diagnosis not present

## 2021-02-09 DIAGNOSIS — R109 Unspecified abdominal pain: Secondary | ICD-10-CM | POA: Diagnosis not present

## 2021-02-22 DIAGNOSIS — E1165 Type 2 diabetes mellitus with hyperglycemia: Secondary | ICD-10-CM | POA: Diagnosis not present

## 2021-02-22 DIAGNOSIS — I10 Essential (primary) hypertension: Secondary | ICD-10-CM | POA: Diagnosis not present

## 2021-02-25 DIAGNOSIS — R002 Palpitations: Secondary | ICD-10-CM | POA: Diagnosis not present

## 2021-02-25 DIAGNOSIS — I480 Paroxysmal atrial fibrillation: Secondary | ICD-10-CM | POA: Diagnosis not present

## 2021-02-25 DIAGNOSIS — Z6834 Body mass index (BMI) 34.0-34.9, adult: Secondary | ICD-10-CM | POA: Diagnosis not present

## 2021-02-28 DIAGNOSIS — I480 Paroxysmal atrial fibrillation: Secondary | ICD-10-CM | POA: Diagnosis not present

## 2021-03-16 DIAGNOSIS — L400 Psoriasis vulgaris: Secondary | ICD-10-CM | POA: Diagnosis not present

## 2021-03-16 DIAGNOSIS — L821 Other seborrheic keratosis: Secondary | ICD-10-CM | POA: Diagnosis not present

## 2021-03-16 DIAGNOSIS — L408 Other psoriasis: Secondary | ICD-10-CM | POA: Diagnosis not present

## 2021-03-16 DIAGNOSIS — D2362 Other benign neoplasm of skin of left upper limb, including shoulder: Secondary | ICD-10-CM | POA: Diagnosis not present

## 2021-03-22 DIAGNOSIS — L400 Psoriasis vulgaris: Secondary | ICD-10-CM | POA: Diagnosis not present

## 2021-03-23 DIAGNOSIS — I480 Paroxysmal atrial fibrillation: Secondary | ICD-10-CM | POA: Diagnosis not present

## 2021-04-05 ENCOUNTER — Other Ambulatory Visit: Payer: Self-pay

## 2021-04-05 ENCOUNTER — Encounter: Payer: Self-pay | Admitting: Family Medicine

## 2021-04-05 ENCOUNTER — Ambulatory Visit: Payer: BC Managed Care – PPO | Admitting: Family Medicine

## 2021-04-05 VITALS — BP 118/84 | HR 71 | Ht 68.5 in | Wt 228.0 lb

## 2021-04-05 DIAGNOSIS — G4700 Insomnia, unspecified: Secondary | ICD-10-CM

## 2021-04-05 DIAGNOSIS — G4733 Obstructive sleep apnea (adult) (pediatric): Secondary | ICD-10-CM

## 2021-04-05 DIAGNOSIS — Z9989 Dependence on other enabling machines and devices: Secondary | ICD-10-CM

## 2021-04-05 NOTE — Progress Notes (Signed)
PATIENT: Amanda CoilJoanne C Begley DOB: 04/15/1970  REASON FOR VISIT: follow up HISTORY FROM: patient  Chief Complaint  Patient presents with   Follow-up    Pt alone, rm 2. Here as follow up. Trying to get a new CPAP machine. DME: [facility]- she would like to change DME company. She was set up 04/07/2016. Pt will have today's visit, complete SS and then we can pursue sending to a new company     HISTORY OF PRESENT ILLNESS:  04/05/21 ALL:  Amanda Holt is a 51 y.o. female here today for follow up for OSA on CPAP. She was seen in consult with Dr Vickey Hugerohmeier 12/2020. She was started on trazodone 25mg  QHS and HST ordered as she would be eligible for new CPAP 03/2021. HST has not been completed. She requests new DME once HST performed and CPAP ordered. Currently using [facility].   She usually goes to bed around 8pm. Usually asleep around 8:30. She wakes around 4-5am. She reports waking up multiple times at night due to dreaming. She did not start trazodone for fear of side effects. She has not tried melatonin or valerian root.     HISTORY: (copied from Dr Dohmeier's previous note)  Amanda CoilJoanne C Zacharia is a 51 y.o. -Caucasian female patient was seen here in a sleep consultation  on 12/18/2020 from PCP .  Chief concern according to patient :   Pt has a soon 49 year old CPAP. She had a SS in [place] after being dx with atrial fib. About 2012, and moved here in Jan 22, her refills have expired from previous provider and she is needing to establish locally.  Set up date of [facility] 04/08/16, she had a last sleep study HST- before the new machine in 2017.    Amanda CoilJoanne C Meske  has a past medical history of Anemia,  atrial fibrillation, Atrial flutter (HCC), SVT, ablation, diverticulitis of colon, OSA and on CPAP for a decade, and prediabetes, Hypoglycemia.    CPAP dependent.  Loud snoring. Severe apnea.    Sleep relevant medical history: Nocturia  before CPAP, still 2 a night, sleep walker in her youth,  no ENT, no thyroid  disease.   Family medical /sleep history: cousins  on CPAP with OSA, sons with sleep walking.    Social history: patient is in school.  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   The patient currently works in an office- 6 AM - 3 Pm, works from home .   Tobacco use- none , quit 15 years ago. Smoked since age 51.   ETOH use - rare ,  Caffeine intake only decaffeinated.  Regular exercise in form of 3-4 times a week- home. .   Hobbies : writing, painting, making soaps.     Sleep habits are as follows: The patient's dinner time is between 4.30 - 6.30 PM. The patient goes to bed at 8 PM and continues to sleep for 6 hours of interrupted sleep, wakes for other than  bathroom breaks, the first time at 12 AM.   The preferred sleep position is left side, with the support of 2 pillows.  Dreams are reportedly frequent/vivid- 3-5 dreams a night.Marland Kitchen.  4-5  AM is the usual rise time. The patient wakes up spontaneously at 3.30 AM, when husband leaves for work..  She reports not feeling refreshed or restored in AM, with symptoms such  as dry mouth, morning headaches, and residual fatigue.  Naps are taken infrequently, she fights naps- feels worse- lasting from 60-120  minutes and are less refreshing than nocturnal sleep.    REVIEW OF SYSTEMS: Out of a complete 14 system review of symptoms, the patient complains only of the following symptoms, insomnia, frequent dreaming and all other reviewed systems are negative.  ESS: 11  ALLERGIES: Allergies  Allergen Reactions   Barbiturates Nausea And Vomiting and Other (See Comments)    Patient states this medication makes her pass out.   Codeine Nausea And Vomiting    HOME MEDICATIONS: Outpatient Medications Prior to Visit  Medication Sig Dispense Refill   ECHINACEA PO Take 1 capsule by mouth daily as needed (for cold symptoms.).     ibuprofen (ADVIL) 600 MG tablet Take 1  tablet (600 mg total) by mouth every 6 (six) hours as needed. 30 tablet 0   meclizine (ANTIVERT) 25 MG tablet Take 1 tablet (25 mg total) by mouth 3 (three) times daily as needed for dizziness. 12 tablet 0   methocarbamol (ROBAXIN) 500 MG tablet Take 1 tablet (500 mg total) by mouth 2 (two) times daily. 20 tablet 0   metoprolol tartrate (LOPRESSOR) 25 MG tablet Take 25 mg by mouth 2 (two) times daily.     norethindrone (MICRONOR,CAMILA,ERRIN) 0.35 MG tablet Take 1 tablet by mouth daily.  3   ondansetron (ZOFRAN ODT) 4 MG disintegrating tablet Take 1 tablet (4 mg total) by mouth every 8 (eight) hours as needed for nausea or vomiting. 12 tablet 0   ondansetron (ZOFRAN) 4 MG tablet Take 1 tablet (4 mg total) by mouth every 8 (eight) hours as needed for nausea or vomiting. 10 tablet 0   promethazine (PHENERGAN) 12.5 MG tablet Take 2 tablets (25 mg total) by mouth every 6 (six) hours as needed for nausea or vomiting. 20 tablet 0   sucralfate (CARAFATE) 1 g tablet Take 1 tablet (1 g total) by mouth 4 (four) times daily as needed (for abdominal discomfort, nausea, and/or vomiting). 30 tablet 1   valsartan (DIOVAN) 40 MG tablet Take 40 mg by mouth daily.     vitamin C (ASCORBIC ACID) 500 MG tablet Take 500 mg by mouth daily.     traZODone (DESYREL) 50 MG tablet Take 0.5 tablets (25 mg total) by mouth at bedtime as needed for sleep. 30 tablet 0   chlorthalidone (HYGROTON) 25 MG tablet Take 12.5 mg by mouth daily.  5   lidocaine (XYLOCAINE) 2 % solution Use as directed 15 mLs in the mouth or throat every 6 (six) hours as needed (to apply rectally for pain as needed). 100 mL 0   metroNIDAZOLE (FLAGYL) 500 MG tablet Take 1 tablet (500 mg total) by mouth 3 (three) times daily. 30 tablet 0   Probiotic Product (PROBIOTIC ACIDOPHILUS) CAPS Take 1 capsule by mouth daily.     No facility-administered medications prior to visit.    PAST MEDICAL HISTORY: Past Medical History:  Diagnosis Date   Anemia    Atrial  flutter (HCC)    Hx of diverticulitis of colon    Hypoglycemia     PAST SURGICAL HISTORY: Past Surgical History:  Procedure Laterality Date   APPENDECTOMY     cesarian section     eblation      FAMILY HISTORY: Family History  Problem Relation Age of Onset   Breast cancer Paternal Aunt 74   Breast cancer Paternal Grandmother 38    SOCIAL HISTORY: Social  History   Socioeconomic History   Marital status: Married    Spouse name: Not on file   Number of children: Not on file   Years of education: Not on file   Highest education level: Not on file  Occupational History   Not on file  Tobacco Use   Smoking status: Former   Smokeless tobacco: Never  Vaping Use   Vaping Use: Never used  Substance and Sexual Activity   Alcohol use: Yes    Comment: occasional   Drug use: No   Sexual activity: Yes  Other Topics Concern   Not on file  Social History Narrative   Not on file   Social Determinants of Health   Financial Resource Strain: Not on file  Food Insecurity: Not on file  Transportation Needs: Not on file  Physical Activity: Not on file  Stress: Not on file  Social Connections: Not on file  Intimate Partner Violence: Not on file     PHYSICAL EXAM  Vitals:   04/05/21 1018  BP: 118/84  Pulse: 71  Weight: 228 lb (103.4 kg)  Height: 5' 8.5" (1.74 m)   Body mass index is 34.16 kg/m.  Generalized: Well developed, in no acute distress  Cardiology: normal rate and rhythm, no murmur noted Respiratory: clear to auscultation bilaterally  Neurological examination  Mentation: Alert oriented to time, place, history taking. Follows all commands speech and language fluent Cranial nerve II-XII: Pupils were equal round reactive to light. Extraocular movements were full, visual field were full  Motor: The motor testing reveals 5 over 5 strength of all 4 extremities. Good symmetric motor tone is noted throughout.  Gait and station: Gait is normal.    DIAGNOSTIC DATA  (LABS, IMAGING, TESTING) - I reviewed patient records, labs, notes, testing and imaging myself where available.  No flowsheet data found.   Lab Results  Component Value Date   WBC 7.6 01/04/2021   HGB 13.0 01/04/2021   HCT 40.3 01/04/2021   MCV 86.7 01/04/2021   PLT 352 01/04/2021      Component Value Date/Time   NA 138 01/04/2021 2137   NA 138 05/02/2014 1523   K 3.3 (L) 01/04/2021 2137   K 3.8 05/02/2014 1523   CL 100 01/04/2021 2137   CL 107 05/02/2014 1523   CO2 28 01/04/2021 2137   CO2 24 05/02/2014 1523   GLUCOSE 170 (H) 01/04/2021 2137   GLUCOSE 110 (H) 05/02/2014 1523   BUN 13 01/04/2021 2137   BUN 10 05/02/2014 1523   CREATININE 0.64 01/04/2021 2137   CREATININE 0.57 05/02/2014 1523   CALCIUM 9.6 01/04/2021 2137   CALCIUM 9.3 05/02/2014 1523   PROT 7.5 01/04/2021 2137   PROT 8.2 (H) 05/02/2014 1523   ALBUMIN 4.2 01/04/2021 2137   ALBUMIN 4.6 05/02/2014 1523   AST 18 01/04/2021 2137   AST 18 05/02/2014 1523   ALT 21 01/04/2021 2137   ALT 19 05/02/2014 1523   ALKPHOS 64 01/04/2021 2137   ALKPHOS 67 05/02/2014 1523   BILITOT 0.6 01/04/2021 2137   BILITOT 0.8 05/02/2014 1523   GFRNONAA >60 01/04/2021 2137   GFRNONAA >60 05/02/2014 1523   GFRAA >60 11/03/2017 0223   GFRAA >60 05/02/2014 1523   No results found for: CHOL, HDL, LDLCALC, LDLDIRECT, TRIG, CHOLHDL No results found for: NOBS9G No results found for: VITAMINB12 No results found for: TSH   ASSESSMENT AND PLAN 51 y.o. year old female  has a past medical history of Anemia, Atrial  flutter (HCC), diverticulitis of colon, and Hypoglycemia. here with     ICD-10-CM   1. OSA on CPAP  G47.33    Z99.89     2. Insomnia, unspecified type  G47.00         CALANDRIA MULLINGS is doing well on CPAP therapy. Compliance report reveals excellent compliance. She was encouraged to continue using CPAP nightly and for greater than 4 hours each night. I will ask sleep lab to schedule HST as ordered by Dr Vickey Huger. She  was encouraged to try melatonin or valerian root OTC for insomnia. May try trazodone if she wishes. Risks of untreated sleep apnea review and education materials provided. Healthy lifestyle habits encouraged. She will follow up in 31-90 days following set up of new CPAP, sooner if needed. She verbalizes understanding and agreement with this plan.    No orders of the defined types were placed in this encounter.    No orders of the defined types were placed in this encounter.     Shawnie Dapper, FNP-C 04/05/2021, 10:41 AM [facility] [address] [place], [place] [address] [phone]

## 2021-04-05 NOTE — Patient Instructions (Addendum)
Please continue using your CPAP regularly. While your insurance requires that you use CPAP at least 4 hours each night on 70% of the nights, I recommend, that you not skip any nights and use it throughout the night if you can. Getting used to CPAP and staying with the treatment long term does take time and patience and discipline. Untreated obstructive sleep apnea when it is moderate to severe can have an adverse impact on cardiovascular health and raise her risk for heart disease, arrhythmias, hypertension, congestive heart failure, stroke and diabetes. Untreated obstructive sleep apnea causes sleep disruption, nonrestorative sleep, and sleep deprivation. This can have an impact on your day to day functioning and cause daytime sleepiness and impairment of cognitive function, memory loss, mood disturbance, and problems focussing. Using CPAP regularly can improve these symptoms. ? ? ?We will schedule home sleep study so that we can order you a new CPAP. You can try trazodone if you wish. Melatonin or Valerian root can also help. You can try these over the counter.  ? ?Follow up in 31-90 days following set up of new CPAP machine.  ?

## 2021-04-21 DIAGNOSIS — R7303 Prediabetes: Secondary | ICD-10-CM | POA: Diagnosis not present

## 2021-04-21 DIAGNOSIS — I10 Essential (primary) hypertension: Secondary | ICD-10-CM | POA: Diagnosis not present

## 2021-04-21 DIAGNOSIS — E782 Mixed hyperlipidemia: Secondary | ICD-10-CM | POA: Diagnosis not present

## 2021-04-26 ENCOUNTER — Ambulatory Visit (INDEPENDENT_AMBULATORY_CARE_PROVIDER_SITE_OTHER): Payer: BC Managed Care – PPO | Admitting: Neurology

## 2021-04-26 DIAGNOSIS — Z9989 Dependence on other enabling machines and devices: Secondary | ICD-10-CM

## 2021-04-26 DIAGNOSIS — G4733 Obstructive sleep apnea (adult) (pediatric): Secondary | ICD-10-CM

## 2021-04-26 DIAGNOSIS — I10 Essential (primary) hypertension: Secondary | ICD-10-CM | POA: Diagnosis not present

## 2021-04-26 DIAGNOSIS — I480 Paroxysmal atrial fibrillation: Secondary | ICD-10-CM

## 2021-04-26 DIAGNOSIS — E669 Obesity, unspecified: Secondary | ICD-10-CM

## 2021-04-26 DIAGNOSIS — F5105 Insomnia due to other mental disorder: Secondary | ICD-10-CM

## 2021-04-26 DIAGNOSIS — E1165 Type 2 diabetes mellitus with hyperglycemia: Secondary | ICD-10-CM | POA: Diagnosis not present

## 2021-04-26 DIAGNOSIS — E782 Mixed hyperlipidemia: Secondary | ICD-10-CM | POA: Diagnosis not present

## 2021-04-28 NOTE — Progress Notes (Signed)
? ? ? ?  ?  ?[facility] ?  ?HOME SLEEP TEST REPORT ( by Watch PAT)   ?STUDY DATE:  04-26-2021 ?  ?ORDERING CLINICIAN: Larey Seat, MD  ?REFERRING CLINICIAN: Debbora Presto, NP  ?  ?CLINICAL INFORMATION/HISTORY: paroxysmal atrial fibrillation, chest pain and insomnia.  ? ?04/05/21 ALL:  ?Amanda Holt is a 51 y.o. female here today for follow up for OSA on CPAP. She was seen in consult with Dr Brett Fairy 12/2020. She was started on trazodone 25mg  QHS and HST ordered as she would be eligible for new CPAP 03/2021. she is 100% compliant with CPAP use. Residual AHI of 0.6/h  on manufacturer settings 4-10 cm water CPOAP. No EPR. 95% pressure was 8.3 cm water.  ?HST has not been completed. She requests new DME once HST performed and CPAP ordered. Currently using [facility].  ?  ?  ?Epworth sleepiness score: 10/24. FSS at 45 points.  ?  ?BMI: 34.1 kg/m? ?  ?Neck Circumference: 16" ?  ?FINDINGS: ?  ?Sleep Summary: ?  ?Total Recording Time (hours, min): Total recording time amounted to 8 hours and 31 minutes of which 7 hours and 20 minutes was a total recorded sleep time.  REM sleep amounted to 20.3% of total recorded sleep time.                                  ?  ?Respiratory Indices: ?  ?Calculated pAHI (per hour):   The overall apnea hypopnea index was 19.5/h of sleep, during REM sleep at 23.1 and during non-REM sleep at 18.6/h.                                             ?  ?Positional AHI: In supine sleep the patient reached an AHI of 35.6/h, and nonsupine of 3.6/h there was significantly less apnea when sleeping on the right side than in other sleep positions.   ? ?Snoring statistics show a mean volume of 41 dB and snoring accompanied less than 20% of the total sleep time.                                                 ?  ?Oxygen Saturation Statistics: ?  ?O2 Saturation Range (%):   Between a nadir of 83 and a maximum of 99% with a mean saturation at 95%.                                  ?  ?O2 Saturation (minutes)  <89%: 2 minutes       ?  ?Pulse Rate Statistics: ?  ?Pulse Mean (bpm):     53 bpm          ?  ?Pulse Range: Between 38 and 102 bpm.              ?  ?IMPRESSION:  This HST confirms the presence of mild to moderate sleep apnea of obstructive type.  This apnea was not REM sleep dependent but positional dependent.   ?Avoiding supine sleep alone reduces the apnea significantly to a mild  degree. ? ?  ?RECOMMENDATION: This patient still has enough sleep apnea to warrant CPAP use and a new auto titration device will be ordered for her.  She can use the interface of her choice with an AutoSet between 5 and 12 cm water pressure 1 cm EPR and heated humidification.  I would like to remind the patient that she could also reduce her apnea by avoiding supine sleep.  I did not find an explanation for her degree of fatigue as there is very little residual apnea on her download and her home sleep test to confirm that no significant hypoxia is present. ? ?I do not consider CPAP optional( to be replaced by a dental device or any other treatment) as the patient has a history of atrial fibrillation. ? ?  ?INTERPRETING PHYSICIAN: ? ? Larey Seat, MD  ? ?Medical Director of [facility] [employer].  ? ? ? ? ? ? ? ? ? ? ? ? ? ? ? ? ? ? ? ? ?

## 2021-05-04 DIAGNOSIS — I480 Paroxysmal atrial fibrillation: Secondary | ICD-10-CM | POA: Insufficient documentation

## 2021-05-04 NOTE — Procedures (Signed)
? ?  ?  ?[facility] ?  ?HOME SLEEP TEST REPORT ( by Watch PAT)   ?STUDY DATE:  04-26-2021 ?  ?ORDERING CLINICIAN: Larey Seat, MD  ?REFERRING CLINICIAN: Debbora Presto, NP  ?  ?CLINICAL INFORMATION/HISTORY: paroxysmal atrial fibrillation, chest pain and insomnia.  ? ?04/05/21 ALL:  ?Amanda Holt is a 51 y.o. female here today for follow up for OSA on CPAP. She was seen in consult with Dr Brett Fairy 12/2020. She was started on trazodone 25mg  QHS and HST ordered as she would be eligible for new CPAP 03/2021. she is 100% compliant with CPAP use. Residual AHI of 0.6/h  on manufacturer settings 4-10 cm water CPOAP. No EPR. 95% pressure was 8.3 cm water.  ?HST has not been completed. She requests new DME once HST performed and CPAP ordered. Currently using [facility].  ?  ?  ?Epworth sleepiness score: 10/24. FSS at 45 points.  ?  ?BMI: 34.1 kg/m? ?  ?Neck Circumference: 16" ?  ?FINDINGS: ?  ?Sleep Summary: ?  ?Total Recording Time (hours, min): Total recording time amounted to 8 hours and 31 minutes of which 7 hours and 20 minutes was a total recorded sleep time.  REM sleep amounted to 20.3% of total recorded sleep time.                                  ?  ?Respiratory Indices: ?  ?Calculated pAHI (per hour):   The overall apnea hypopnea index was 19.5/h of sleep, during REM sleep at 23.1 and during non-REM sleep at 18.6/h.                                             ?  ?Positional AHI: In supine sleep the patient reached an AHI of 35.6/h, and nonsupine of 3.6/h there was significantly less apnea when sleeping on the right side than in other sleep positions.   ? ?Snoring statistics show a mean volume of 41 dB and snoring accompanied less than 20% of the total sleep time.                                                 ?  ?Oxygen Saturation Statistics: ?  ?O2 Saturation Range (%):   Between a nadir of 83 and a maximum of 99% with a mean saturation at 95%.                                  ?  ?O2 Saturation (minutes) <89%:  2 minutes       ?  ?Pulse Rate Statistics: ?  ?Pulse Mean (bpm):     53 bpm          ?  ?Pulse Range: Between 38 and 102 bpm.              ?  ?IMPRESSION:  This HST confirms the presence of mild to moderate sleep apnea of obstructive type.  This apnea was not REM sleep dependent but positional dependent.   ?Avoiding supine sleep alone reduces the apnea significantly to a mild degree. ? ?  ?  RECOMMENDATION: This patient still has enough sleep apnea to warrant CPAP use and a new auto titration device will be ordered for her.  She can use the interface of her choice with an AutoSet between 5 and 12 cm water pressure 1 cm EPR and heated humidification.  I would like to remind the patient that she could also reduce her apnea by avoiding supine sleep.  I did not find an explanation for her degree of fatigue as there is very little residual apnea on her download and her home sleep test to confirm that no significant hypoxia is present. ? ?I do not consider CPAP optional( to be replaced by a dental device or any other treatment) as the patient has a history of atrial fibrillation. ? ?  ?INTERPRETING PHYSICIAN: ? ? Larey Seat, MD  ? ?Medical Director of [facility] [employer].  ? ? ? ? ? ? ? ? ? ? ? ? ? ? ? ? ? ? ? ? ?

## 2021-05-04 NOTE — Addendum Note (Signed)
Addended by: Larey Seat on: 05/04/2021 06:14 PM ? ? Modules accepted: Orders ? ?

## 2021-05-04 NOTE — Progress Notes (Signed)
IMPRESSION:  This HST confirms the presence of mild to moderate sleep apnea of obstructive type.  This apnea was not REM sleep dependent but positional dependent.   ?Avoiding supine sleep alone reduces the apnea significantly to a mild degree. ?? ?? ?RECOMMENDATION: This patient still has enough sleep apnea to warrant CPAP use and a new auto titration device will be ordered for her.  She can use the interface of her choice with an AutoSet between 5 and 12 cm water pressure 1 cm EPR and heated humidification.  I would like to remind the patient that she could also reduce her apnea by avoiding supine sleep.  I did not find an explanation for her degree of fatigue as there is very little residual apnea on her download and her home sleep test to confirm that no significant hypoxia is present. ?? ?I do not consider CPAP optional( to be replaced by a dental device or any other treatment) as the patient has a history of atrial fibrillation. ??

## 2021-05-05 ENCOUNTER — Telehealth: Payer: Self-pay | Admitting: Neurology

## 2021-05-05 ENCOUNTER — Encounter: Payer: Self-pay | Admitting: Neurology

## 2021-05-05 NOTE — Telephone Encounter (Signed)
I called Amanda Holt. I advised Amanda Holt that Dr. Vickey Huger reviewed their sleep study results and found that Amanda Holt has moderate osa. Dr. Vickey Huger recommends that Amanda Holt continue CPAP therapy. I reviewed PAP compliance expectations with the Amanda Holt. Amanda Holt is agreeable to starting a CPAP. I advised Amanda Holt that an order will be sent to a DME, [facility], and [facility] will call the Amanda Holt within about one week after they file with the Amanda Holt's insurance. [facility] will show the Amanda Holt how to use the machine, fit for masks, and troubleshoot the CPAP if needed. A follow up appt was made for insurance purposes with Dr. Vickey Huger on 08/02/2021 at 2:30 pm. Amanda Holt verbalized understanding to arrive 15 minutes early and bring their CPAP. A letter with all of this information in it will be mailed to the Amanda Holt as a reminder. I verified with the Amanda Holt that the address we have on file is correct. Amanda Holt verbalized understanding of results. Amanda Holt had no questions at this time but was encouraged to call back if questions arise. I have sent the order to [facility] and have received confirmation that they have received the order. ? ?

## 2021-05-05 NOTE — Telephone Encounter (Signed)
-----   Message from Melvyn Novas, MD sent at 05/04/2021  6:14 PM EDT ----- ?IMPRESSION:  This HST confirms the presence of mild to moderate sleep apnea of obstructive type.  This apnea was not REM sleep dependent but positional dependent.   ?Avoiding supine sleep alone reduces the apnea significantly to a mild degree. ?? ?? ?RECOMMENDATION: This patient still has enough sleep apnea to warrant CPAP use and a new auto titration device will be ordered for her.  She can use the interface of her choice with an AutoSet between 5 and 12 cm water pressure 1 cm EPR and heated humidification.  I would like to remind the patient that she could also reduce her apnea by avoiding supine sleep.  I did not find an explanation for her degree of fatigue as there is very little residual apnea on her download and her home sleep test to confirm that no significant hypoxia is present. ?? ?I do not consider CPAP optional( to be replaced by a dental device or any other treatment) as the patient has a history of atrial fibrillation. ?? ?

## 2021-05-10 DIAGNOSIS — R519 Headache, unspecified: Secondary | ICD-10-CM | POA: Diagnosis not present

## 2021-05-13 DIAGNOSIS — G4733 Obstructive sleep apnea (adult) (pediatric): Secondary | ICD-10-CM | POA: Diagnosis not present

## 2021-06-02 DIAGNOSIS — L408 Other psoriasis: Secondary | ICD-10-CM | POA: Diagnosis not present

## 2021-06-02 DIAGNOSIS — L400 Psoriasis vulgaris: Secondary | ICD-10-CM | POA: Diagnosis not present

## 2021-06-12 DIAGNOSIS — G4733 Obstructive sleep apnea (adult) (pediatric): Secondary | ICD-10-CM | POA: Diagnosis not present

## 2021-06-14 DIAGNOSIS — F411 Generalized anxiety disorder: Secondary | ICD-10-CM | POA: Diagnosis not present

## 2021-06-14 DIAGNOSIS — R519 Headache, unspecified: Secondary | ICD-10-CM | POA: Diagnosis not present

## 2021-06-14 DIAGNOSIS — I10 Essential (primary) hypertension: Secondary | ICD-10-CM | POA: Diagnosis not present

## 2021-06-14 DIAGNOSIS — F331 Major depressive disorder, recurrent, moderate: Secondary | ICD-10-CM | POA: Diagnosis not present

## 2021-07-13 DIAGNOSIS — G4733 Obstructive sleep apnea (adult) (pediatric): Secondary | ICD-10-CM | POA: Diagnosis not present

## 2021-07-21 DIAGNOSIS — I4891 Unspecified atrial fibrillation: Secondary | ICD-10-CM | POA: Diagnosis not present

## 2021-07-21 DIAGNOSIS — H6121 Impacted cerumen, right ear: Secondary | ICD-10-CM | POA: Diagnosis not present

## 2021-07-21 DIAGNOSIS — J309 Allergic rhinitis, unspecified: Secondary | ICD-10-CM | POA: Diagnosis not present

## 2021-07-21 DIAGNOSIS — I10 Essential (primary) hypertension: Secondary | ICD-10-CM | POA: Diagnosis not present

## 2021-07-26 DIAGNOSIS — R7303 Prediabetes: Secondary | ICD-10-CM | POA: Diagnosis not present

## 2021-07-26 DIAGNOSIS — I10 Essential (primary) hypertension: Secondary | ICD-10-CM | POA: Diagnosis not present

## 2021-07-26 DIAGNOSIS — E782 Mixed hyperlipidemia: Secondary | ICD-10-CM | POA: Diagnosis not present

## 2021-07-26 DIAGNOSIS — G629 Polyneuropathy, unspecified: Secondary | ICD-10-CM | POA: Diagnosis not present

## 2021-07-30 DIAGNOSIS — I10 Essential (primary) hypertension: Secondary | ICD-10-CM | POA: Diagnosis not present

## 2021-07-30 DIAGNOSIS — E559 Vitamin D deficiency, unspecified: Secondary | ICD-10-CM | POA: Diagnosis not present

## 2021-07-30 DIAGNOSIS — E1165 Type 2 diabetes mellitus with hyperglycemia: Secondary | ICD-10-CM | POA: Diagnosis not present

## 2021-07-30 DIAGNOSIS — E782 Mixed hyperlipidemia: Secondary | ICD-10-CM | POA: Diagnosis not present

## 2021-08-02 ENCOUNTER — Encounter: Payer: Self-pay | Admitting: Neurology

## 2021-08-02 ENCOUNTER — Ambulatory Visit: Payer: BC Managed Care – PPO | Admitting: Neurology

## 2021-08-02 VITALS — BP 126/79 | HR 66 | Ht 68.5 in | Wt 227.0 lb

## 2021-08-02 DIAGNOSIS — Z9989 Dependence on other enabling machines and devices: Secondary | ICD-10-CM

## 2021-08-02 DIAGNOSIS — G4700 Insomnia, unspecified: Secondary | ICD-10-CM | POA: Diagnosis not present

## 2021-08-02 DIAGNOSIS — G4733 Obstructive sleep apnea (adult) (pediatric): Secondary | ICD-10-CM

## 2021-08-02 NOTE — Progress Notes (Signed)
SLEEP MEDICINE CLINIC    Provider:  Melvyn Novas, MD  Primary Care Physician:  Lewis Moccasin, MD [address] [place] [address]     Referring Provider: Lewis Moccasin, Md [address] [place],  [place] [address]          Chief Complaint according to patient   Patient presents with:     RV on new CPAP.  Pt here for initial CPAP f/u. Pt reports doing well. Pt has a vitamin D deficiency. Now taking supplements. Has sinus issues that makes it hard at times. Has noticed that she is no longer waking up gasping for air. Patient (Initial Visit)           HISTORY OF PRESENT ILLNESS:  Amanda Holt is a 51 y.o. -Caucasian female patient was seen here in a Rv  08/02/2021, following a  sleep consultation  from PCP .   As discussed in our initial visit this patient has been using CPAP for over 10 years now her last sleep study prior to visiting with [place] was in [facility] following a diagnosis of atrial fibrillation.  As she had been on CPAP for over 5 years we repeated a home sleep test and a home sleep test was performed on 04-26-2021.  The patient at the time endorsed the fatigue severity score at 45 points and the Epworth sleepiness score at 10 out of 24.  BMI was 34.  The patient had a total calculated sleep time of 7 hours 20 minutes with 20% REM sleep time.  Overall AHI was 19.5 and only slightly higher during REM sleep.  Mean volume was low snoring was only present for 20% of the recorded sleep time.  Supine sleep was the most aggravating factor to exacerbate the AHI to 35.6/h and in nonsupine sleep there was much less apnea.  I reordered an auto titration device with a setting between 5 and 12 cmH2O and 3 cm expiratory pressure relief.  She uses the machine 100% of days and each night over 4 hours with an average of 7 hours 34 minutes.  Residual AHI is 0.8/h which is an excellent resolution 95th percentile pressure is 8 cmH2O 95th percentile air leak is 9.5 L/min we do not  need to make any changes to her current settings. I also learned that she has been diagnosed with vitamin D deficiency and takes now only once a week 50,000 unit prescription dose.  Other medications were unchanged except for Micronor being discontinued.        Chief concern according to patient :   Pt has a soon 49 year old CPAP. She had a SS in [place] after being dx with atrial fib. About 2012, and moved here in Jan 22, her refills have expired from previous provider and she is needing to establish locally.  Set up date of [facility] 04/08/16, she had a last sleep study HST- before the new machine in 2017.    Amanda Holt  has a past medical history of Anemia,  atrial fibrillation, Atrial flutter (HCC), SVT, ablation, diverticulitis of colon, OSA and on CPAP for a decade, and prediabetes, Hypoglycemia.   CPAP dependent.  Loud snoring. Severe apnea. The current auto titration device shows 100% compliance for days and hours with an average time of 8 hours and 1 minute each night.  This is an AutoSet at factory settings between 4 and 20 cmH2O and without any setting for expiratory pressure relief.  The patient's 95th percentile pressure is at 8.1 cmH2O so there is no high pressure needed to overcome her apnea.  Residual AHI is excellent at 0.6/h.  There are equal amounts of central and obstructive events.  She does not have high air leakage the 95th percentile air leak is 4 L a minute.  She is using a nasal cradle mask likely a model by [employer].  The needs a new water reservoir and heated tubing.    Sleep relevant medical history: Nocturia  before CPAP, still 2 a night, sleep walker in her youth,  no ENT, no thyroid disease.   Family medical /sleep history: cousins  on CPAP with OSA, sons with sleep walking.    Social history: patient is in school.  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    The patient currently works in an office- 6 AM - 3 Pm, works from home .  Tobacco use- none , quit 15 years ago. Smoked since age 31.   ETOH use - rare ,  Caffeine intake only decaffeinated.  Regular exercise in form of 3-4 times a week- home. .   Hobbies : writing, painting, making soaps.       Sleep habits are as follows: The patient's dinner time is between 4.30 - 6.30 PM. The patient goes to bed at 8 PM and continues to sleep for 6 hours of interrupted sleep, wakes for other than  bathroom breaks, the first time at 12 AM.   The preferred sleep position is left side, with the support of 2 pillows.  Dreams are reportedly frequent/vivid- 3-5 dreams a night.Marland Kitchen  4-5  AM is the usual rise time. The patient wakes up spontaneously at 3.30 AM, when husband leaves for work..  She reports not feeling refreshed or restored in AM, with symptoms such as dry mouth, morning headaches, and residual fatigue.  Naps are taken infrequently, she fights naps- feels worse- lasting from 60-120  minutes and are less refreshing than nocturnal sleep.    Review of Systems: Out of a complete 14 system review, the patient complains of only the following symptoms, and all other reviewed systems are negative.:  Fatigue, sleepiness, fragmented sleep, Nocturia 1-2 times.   Insomnia - broken sleep, vivid dreams. On the new CPAP these arousals are still present.    How likely are you to doze in the following situations: 0 = not likely, 1 = slight chance, 2 = moderate chance, 3 = high chance   Sitting and Reading? Watching Television? Sitting inactive in a public place (theater or meeting)? As a passenger in a car for an hour without a break? Lying down in the afternoon when circumstances permit? Sitting and talking to someone? Sitting quietly after lunch without alcohol? In a car, while stopped for a few minutes in traffic?   Total = on CPAP 08-02-2021; 4/ 24 points   FSS endorsed pre CPAP at 45/ 63 points., and  now on CPAP at  34/ 63 points.  Only one nocturia most nights.  Feeling refreshed in AM.   The patient at the time endorsed pre CPAP the fatigue severity score at 45 points and the Epworth sleepiness score at 10 out of 24.  Social History   Socioeconomic History   Marital status: Married    Spouse name:    Number of children:   Has 4 sons: 2 sons  at home, one elder son in the [employer], one married.    Years of education: HS and Bachelors in [place] and [employer].    Highest education level: double baccalaureate   Occupational History    [employer]. Executive relations, 6 years.   Tobacco Use   Smoking status: Former   Smokeless tobacco: Never  Vaping Use   Vaping Use: Never used  Substance and Sexual Activity   Alcohol use: Yes    Comment: occasional   Drug use: No   Sexual activity: Yes  Other Topics Concern   Not on file  Social History Narrative   Not on file   Social Determinants of Health   Financial Resource Strain: Not on file  Food Insecurity: Not on file  Transportation Needs: Not on file  Physical Activity: Not on file  Stress: Not on file  Social Connections: Not on file    Family History  Problem Relation Age of Onset   Breast cancer Paternal Aunt 46   Breast cancer Paternal Grandmother 69    Past Medical History:  Diagnosis Date   Anemia    Atrial flutter (HCC)    Hx of diverticulitis of colon    Hypoglycemia     Past Surgical History:  Procedure Laterality Date   APPENDECTOMY     cesarian section     eblation       Current Outpatient Medications on File Prior to Visit  Medication Sig Dispense Refill   ECHINACEA PO Take 1 capsule by mouth daily as needed (for cold symptoms.).     ibuprofen (ADVIL) 600 MG tablet Take 1 tablet (600 mg total) by mouth every 6 (six) hours as needed. 30 tablet 0   meclizine (ANTIVERT) 25 MG tablet Take 1 tablet (25 mg total) by mouth 3 (three) times daily as needed for dizziness. 12 tablet 0   methocarbamol  (ROBAXIN) 500 MG tablet Take 1 tablet (500 mg total) by mouth 2 (two) times daily. 20 tablet 0   metoprolol succinate (TOPROL-XL) 50 MG 24 hr tablet Take 50 mg by mouth daily.     norethindrone (MICRONOR,CAMILA,ERRIN) 0.35 MG tablet Take 1 tablet by mouth daily.  3   ondansetron (ZOFRAN ODT) 4 MG disintegrating tablet Take 1 tablet (4 mg total) by mouth every 8 (eight) hours as needed for nausea or vomiting. 12 tablet 0   ondansetron (ZOFRAN) 4 MG tablet Take 1 tablet (4 mg total) by mouth every 8 (eight) hours as needed for nausea or vomiting. 10 tablet 0   promethazine (PHENERGAN) 12.5 MG tablet Take 2 tablets (25 mg total) by mouth every 6 (six) hours as needed for nausea or vomiting. 20 tablet 0   sucralfate (CARAFATE) 1 g tablet Take 1 tablet (1 g total) by mouth 4 (four) times daily as needed (for abdominal discomfort, nausea, and/or vomiting). 30 tablet 1   vitamin C (ASCORBIC ACID) 500 MG tablet Take 500 mg by mouth daily.     Vitamin D, Ergocalciferol, (DRISDOL) 1.25 MG (50000 UNIT) CAPS capsule Take 50,000 Units by mouth once a week.     No current facility-administered medications on file prior to visit.    Allergies  Allergen Reactions   Barbiturates Nausea And Vomiting and Other (See Comments)    Patient states this medication makes her pass out.   Codeine Nausea And Vomiting    Physical exam:  Today's Vitals   08/02/21 1430  BP: 126/79  Pulse: 66  Weight: 227 lb (103 kg)  Height: 5' 8.5" (1.74 m)   Body mass index is 34.01 kg/m.   Wt Readings from Last 3 Encounters:  08/02/21 227 lb (103 kg)  04/05/21 228 lb (103.4 kg)  12/18/20 225 lb (102.1 kg)     Ht Readings from Last 3 Encounters:  08/02/21 5' 8.5" (1.74 m)  04/05/21 5' 8.5" (1.74 m)  12/18/20 5\' 8"  (1.727 m)      General: The patient is awake, alert and appears not in acute distress. The patient is well groomed. Head: Normocephalic, atraumatic. Neck is supple. Mallampati 2-3 , dentures. ,  neck  circumference:16 inches . Nasal airflow patent.  Retrognathia is  seen.  Dental status: dentures.  Cardiovascular:  Regular rate and cardiac rhythm by pulse,  without distended neck veins. Respiratory: Lungs are clear to auscultation.  Skin:  Without evidence of ankle edema, or rash. Trunk: The patient's posture is erect.   Neurologic exam : The patient is awake and alert, oriented to place and time.   Memory subjective described as intact.  Attention span & concentration ability appears normal.  Speech is fluent,  without  dysarthria, dysphonia or aphasia.  Mood and affect are appropriate.   Cranial nerves: no loss of smell or taste reported  Pupils are equal and briskly reactive to light. Funduscopic exam deferred.  Extraocular movements in vertical and horizontal planes were intact and without nystagmus. No Diplopia. Visual fields by finger perimetry are intact. Hearing was intact to soft voice and tuning fork, non- lateralizing. Right ear feeling of fullness.   Facial sensation intact to fine touch.  Facial motor strength is symmetric and tongue and uvula move midline.  Neck ROM : rotation, tilt and flexion extension were normal for age and shoulder shrug was symmetrical.    Motor exam:  Symmetric bulk, tone and ROM.   Normal tone without cog wheeling, symmetric grip strength .   Sensory:  vibration decreased in right ankles.  Proprioception tested in the upper extremities was normal.   Coordination: Rapid alternating movements in the fingers/hands were of normal speed.  The Finger-to-nose maneuver was intact without evidence of ataxia, dysmetria or tremor.   Gait and station: Patient could rise unassisted from a seated position, walked without assistive device.  Stance is of normal width/ base and the patient turned with 3 steps.( RN report)   Toe and heel walk were deferred.  Deep tendon reflexes: in the  upper and lower extremities are symmetric and intact.  Babinski  response was deferred .    After spending a total time of  21  minutes face to face and additional time for physical and neurologic examination, review of laboratory studies,  personal review of imaging studies, reports and results of other testing and review of referral information / records as far as provided in visit, I have established the following assessments:  1) well controlled apnea-  OSA was confirmed in a HST, mild - moderate - and doing well on CPAP. Started Vit D supplement.     Highly compliant with the new RESMED machine,  2) history of increased sleepiness , now much improved on CPAP, fatigue reduced, nocturia reduced.      My Plan is to proceed with:  1) continue CPAP at current settings.    I would like to thank Lewis Moccasinewey, Elizabeth R, MD and Lewis Moccasinewey, Elizabeth R, Md [address] [place],  [place] [id number] for allowing me to meet with and to take care of this pleasant patient.  I plan to follow up either personally or through our NP within 12 months.  CC: I will share my notes with PCP.  Electronically signed by: Melvyn Novas, MD 08/02/2021 3:07 PM  [facility] and [facility] Board certified by The [employer] of Sleep Medicine and Diplomate of the [employer] of Sleep Medicine. Board certified In Neurology through the ABPN, Fellow of the [employer] of Neurology. Medical Director of [facility].

## 2021-08-02 NOTE — Patient Instructions (Signed)
KEEPING IT CLEAN: CPAP HYGIENE PROPER UPKEEP OF YOUR CPAP MACHINE CAN HELP ENSURE THE DEVICE FUNCTIONS PROPERLY CPAP CLEANING INSTRUCTIONS Along with proper CPAP cleaning it is recommended that you replace your mask, tubing and filters once very 3 months and more frequently if you are sick.   DAILY CLEANING Do not use moisturizing soaps, bleach, scented oils, chlorine, or alcohol-based solutions to clean your supplies. These solutions may cause irritation to your skin and lungs and may reduce the life of your products. Dawn Dish soap or Comparable works best for daily cleaning.  **If you've been sick, it's smart to wash your mask, tubing, humidifier and filter daily until your cold, flu or virus symptoms are gone. That can help reduce the amount of time you spend under the weather.  Before using your mask -wash your face daily with soap and water to remove excess facial oils. Wipe down your mask (including areas that come in contact with your skin) using a damp towel with soap and warm water. This will remove any oils, dead skin cells, and sweat on the mask that can affect the quality of the seal. Gently rinse with a clean towel and let the mask air-dry out of direct sunlight. You can also use unscented baby wipes or pre-moistened towels designed specifically for cleaning CPAP masks, which are available on-line. DO NOT USE CLOROX OR DISINFECTING WIPES. If your unit has a humidifier, empty any leftover water instead of letting in sit in the unit all day. Refill the humidifier with clean, distilled water right before bedtime for optimal use  WEEKLY (OR MORE FREQUENT) CLEANING Your mask and tubing need a full bath at least once a week to keep it free of dust, bacteria, and germs. (During COVID-19 or any other flu/virus we recommend more frequent cleaning) Clean the CPAP tubing, nasal mask, and headgear in a bathroom sink filled with warm water and a few drops of ammonia-free, mild dish detergent. Avoid  using stronger cleaning products, as they may damage the mask or leave harmful residue. Swirl all parts around for about five minutes, rinse well and let air dry during the day. Hang the tubing over the shower rod, on a towel rack or in the laundry room to ensure all the water drips out. The mask and headgear can be air-dried on a towel or hung on a hook or hanger. You should also wipe down your CPAP machine with a damp cloth. Ensure the unit is unplugged. The towel shouldn't be too damp or wet, as water could get into the machine. Clean the filter by removing it and rinsing it in warm tap water. Run it under the water and squeeze to make sure there is no dust. Then blot down the filter with a towel. Do not wash your machine's white filter, if one is present--those are disposable and should be replaced every two weeks. If you are recovering from being sick, we recommend changing the filter sooner. If your CPAP has a humidifier, that also needs to be cleaned weekly. Empty any remaining water and then wash the water chamber in the sink with warm soapy water. Rinse well and drain out as much of the water as possible. Let the chamber air-dry before placing it back into the CPAP unit. Every other week you should disinfect the humidifier. Do that by soaking it in a solution of one-part vinegar to five parts water for 30 minutes, thoroughly rinsing and then placing in your dishwasher's top rack for washing. And   keep it clean by using only distilled water to prevent mineral deposits that can build up and cause damage to your machine.  IMPORTANT TIPS Make caring for your CPAP equipment part of your morning routine. Keep machine and accessories out of direct sunlight to avoid damaging them. Never use bleach to clean accessories. Place machine on a level surface and away from curtains that may interfere with the air intake. Keep track of when you should order replacement parts for your mask and accessories so that  you always get the most out of your CPAP. You can also sign up for Auto [employer] by contacting our DME department at [email or web address]  The following are examples of soap that may be used: [employer] baby soap, Ivory soap (plain).  With a little upkeep, your CPAP can continue to help you breathe better for a long time. Just a few minutes a day can help keep your CPAP running efficiently for years to come.  If you have a CPAP, but are struggling with compliance, check out our no mask oral appliance, for those with mild to moderate sleep apnea.  Call and schedule a consultation with one of our sleep medicine physicians, or ask your doctor about a sleep referral to the [facility].  

## 2021-08-04 DIAGNOSIS — R002 Palpitations: Secondary | ICD-10-CM | POA: Insufficient documentation

## 2021-08-04 NOTE — Progress Notes (Unsigned)
Cardiology Office Note   Date:  08/05/2021   ID:  Amanda Holt, DOB 1971/01/15, MRN [id number]  PCP:  Lewis Moccasin, MD  Cardiologist:   None Referring:  Lewis Moccasin, MD  Chief Complaint  Patient presents with   Palpitations   Shortness of Breath      History of Present Illness: Amanda Holt is a 51 y.o. female who is referred by Lewis Moccasin, MD for evaluation of palpitations.  She has a history of atrial fibrillation/flutter.  I do see some notes from [facility] where she has had her follow-up.  She has had ablation and mapping and she thinks this was around 2014 for her atrial fibrillation.  She has had this was fairly curative although she still gets occasional short paroxysms.  She can feel this and she sees it on a watch but she does not think these are particularly prolonged and they are often triggered by something like caffeine.  However, she is also had a history of SVT.  This really has started over the last several months.  This is very distressing to her.  It feels like a thunderclap.  She feels presyncopal.  She gets very anxious.  She feels like she might die.  She talked to her cardiologist at [facility] and they did apply a monitor and I was able to see these results although not the actual tracings.  She had about 17 episodes of SVT with the longest being 7-1/2 seconds.  The rate can be up to 210.  There was no fibrillation noted.  They did increase her metoprolol from 25 daily to 25 mg twice daily apparently immediate release.  At her visit with Lewis Moccasin, MD recently she was changed to Toprol-XL 50 mg daily.  She was taken off of valsartan and chlorthalidone.  She actually thinks this has helped with the palpitations and they are less symptomatic at this point.  Her other complaints include shortness of breath.  She feels short of breath doing mild walking or activities.  She is not describing PND or orthopnea.  She is not describing new chest pressure, neck  or arm discomfort.  She does not think she has had weight gain or edema.  Past Medical History:  Diagnosis Date   Anemia    Anxiety    Atrial fibrillation (HCC)    Ablation   Atrial flutter (HCC)    HTN (hypertension)    Hx of diverticulitis of colon    Hypoglycemia    Obstructive sleep apnea on CPAP     Past Surgical History:  Procedure Laterality Date   APPENDECTOMY     cesarian section       Current Outpatient Medications  Medication Sig Dispense Refill   Famotidine (PEPCID AC PO) Take 1 tablet by mouth at bedtime.     metoprolol succinate (TOPROL-XL) 50 MG 24 hr tablet Take 50 mg by mouth daily.     metoprolol tartrate (LOPRESSOR) 25 MG tablet Take 0.5 tablets (12.5 mg total) by mouth 3 (three) times daily as needed (for palpitations). 30 tablet 3   norethindrone (MICRONOR,CAMILA,ERRIN) 0.35 MG tablet Take 1 tablet by mouth daily.  3   sucralfate (CARAFATE) 1 g tablet Take 1 tablet (1 g total) by mouth 4 (four) times daily as needed (for abdominal discomfort, nausea, and/or vomiting). 30 tablet 1   vitamin C (ASCORBIC ACID) 500 MG tablet Take 500 mg by mouth daily.     Vitamin D,  Ergocalciferol, (DRISDOL) 1.25 MG (50000 UNIT) CAPS capsule Take 50,000 Units by mouth once a week.     ECHINACEA PO Take 1 capsule by mouth daily as needed (for cold symptoms.). (Patient not taking: Reported on 08/05/2021)     ibuprofen (ADVIL) 600 MG tablet Take 1 tablet (600 mg total) by mouth every 6 (six) hours as needed. (Patient not taking: Reported on 08/05/2021) 30 tablet 0   meclizine (ANTIVERT) 25 MG tablet Take 1 tablet (25 mg total) by mouth 3 (three) times daily as needed for dizziness. (Patient not taking: Reported on 08/05/2021) 12 tablet 0   methocarbamol (ROBAXIN) 500 MG tablet Take 1 tablet (500 mg total) by mouth 2 (two) times daily. (Patient not taking: Reported on 08/05/2021) 20 tablet 0   ondansetron (ZOFRAN ODT) 4 MG disintegrating tablet Take 1 tablet (4 mg total) by mouth every 8  (eight) hours as needed for nausea or vomiting. (Patient not taking: Reported on 08/05/2021) 12 tablet 0   ondansetron (ZOFRAN) 4 MG tablet Take 1 tablet (4 mg total) by mouth every 8 (eight) hours as needed for nausea or vomiting. (Patient not taking: Reported on 08/05/2021) 10 tablet 0   promethazine (PHENERGAN) 12.5 MG tablet Take 2 tablets (25 mg total) by mouth every 6 (six) hours as needed for nausea or vomiting. (Patient not taking: Reported on 08/05/2021) 20 tablet 0   No current facility-administered medications for this visit.    Allergies:   Barbiturates and Codeine    Social History:  The patient  reports that she has quit smoking. She has never used smokeless tobacco. She reports current alcohol use. She reports that she does not use drugs.   Family History:  The patient's family history includes Breast cancer (age of onset: 78) in her paternal aunt; Breast cancer (age of onset: 37) in her paternal grandmother; Cancer in her mother.    ROS:  Please see the history of present illness.   Otherwise, review of systems are positive for none.   All other systems are reviewed and negative.    PHYSICAL EXAM: VS:  BP (!) 156/94 (BP Location: Left Arm, Patient Position: Sitting, Cuff Size: Large)   Pulse 68   Ht 5\' 8"  (1.727 m)   Wt 229 lb 12.8 oz (104.2 kg)   SpO2 97%   BMI 34.94 kg/m  , BMI Body mass index is 34.94 kg/m. GENERAL:  Well appearing HEENT:  Pupils equal round and reactive, fundi not visualized, oral mucosa unremarkable NECK:  No jugular venous distention, waveform within normal limits, carotid upstroke brisk and symmetric, no bruits, no thyromegaly LYMPHATICS:  No cervical, inguinal adenopathy LUNGS:  Clear to auscultation bilaterally BACK:  No CVA tenderness CHEST:  Unremarkable HEART:  PMI not displaced or sustained,S1 and S2 within normal limits, no S3, no S4, no clicks, no rubs, no murmurs ABD:  Flat, positive bowel sounds normal in frequency in pitch, no bruits, no  rebound, no guarding, no midline pulsatile mass, no hepatomegaly, no splenomegaly EXT:  2 plus pulses throughout, no edema, no cyanosis no clubbing SKIN:  No rashes no nodules NEURO:  Cranial nerves II through XII grossly intact, motor grossly intact throughout PSYCH:  Cognitively intact, oriented to person place and time    EKG:  EKG is ordered today. The ekg ordered today demonstrates sinus rhythm, rate 59, axis within normal limits, intervals within normal limits, no acute ST-T wave changes.   Recent Labs: 01/04/2021: ALT 21; BUN 13; Creatinine, Ser 0.64; Hemoglobin  13.0; Platelets 352; Potassium 3.3; Sodium 138    Lipid Panel No results found for: "CHOL", "TRIG", "HDL", "CHOLHDL", "VLDL", "LDLCALC", "LDLDIRECT"    Wt Readings from Last 3 Encounters:  08/05/21 229 lb 12.8 oz (104.2 kg)  08/02/21 227 lb (103 kg)  04/05/21 228 lb (103.4 kg)      Other studies Reviewed: Additional studies/ records that were reviewed today include: Primary care office records and [facility] records Review of the above records demonstrates:  Please see elsewhere in the note.     ASSESSMENT AND PLAN:  Atrial fib: The patient has infrequent and not particularly symptomatic paroxysms.  She has a very low thromboembolic risk.  Anticoagulation is not indicated.  She does not need further intervention for this.  No change in therapy.  SVT: This is the more symptomatic rhythm.  She would not want to consider ablation at this point however because it is fairly well managed.  She understands vagal maneuvers.  Electrolytes are unremarkable but I will be checking a TSH.  I will give her a low-dose of metoprolol tartrate as well to take along with the succinate.  HTN: I would like her to get a blood pressure diary and keep readings.  Shortness of breath: I will check a BNP level.  I am going to check an echocardiogram.  I will see her back after these results to consider further evaluation.   Current medicines  are reviewed at length with the patient today.  The patient does not have concerns regarding medicines.  The following changes have been made: As above  Labs/ tests ordered today include:   Orders Placed This Encounter  Procedures   Brain natriuretic peptide   TSH   EKG 12-Lead   ECHOCARDIOGRAM COMPLETE     Disposition:   FU with me after the above testing   Signed, Rollene Rotunda, MD  08/05/2021 4:15 PM    [facility]

## 2021-08-05 ENCOUNTER — Ambulatory Visit: Payer: BC Managed Care – PPO | Admitting: Cardiology

## 2021-08-05 ENCOUNTER — Encounter: Payer: Self-pay | Admitting: Cardiology

## 2021-08-05 VITALS — BP 156/94 | HR 68 | Ht 68.0 in | Wt 229.8 lb

## 2021-08-05 DIAGNOSIS — R002 Palpitations: Secondary | ICD-10-CM | POA: Diagnosis not present

## 2021-08-05 MED ORDER — METOPROLOL TARTRATE 25 MG PO TABS: 12.5000 mg | ORAL_TABLET | Freq: Three times a day (TID) | ORAL | 3 refills | Status: DC | PRN

## 2021-08-05 NOTE — Patient Instructions (Signed)
Medication Instructions:  TAKE Metoprolol Tartrate 12.5 mg (half a tablet) every 8 hours as needed for palpitations.  *If you need a refill on your cardiac medications before your next appointment, please call your pharmacy*   Lab Work: Your provider would like for you to have the following labs today: BNP, TSH  If you have labs (blood work) drawn today and your tests are completely normal, you will receive your results only by: MyChart Message (if you have MyChart) OR A paper copy in the mail If you have any lab test that is abnormal or we need to change your treatment, we will call you to review the results.   Testing/Procedures: Your physician has requested that you have an echocardiogram. Echocardiography is a painless test that uses sound waves to create images of your heart. It provides your doctor with information about the size and shape of your heart and how well your heart's chambers and valves are working. You may receive an ultrasound enhancing agent through an IV if needed to better visualize your heart during the echo.This procedure takes approximately one hour. There are no restrictions for this procedure. This will take place at the [address].    Follow-Up: At [facility], you and your health needs are our priority.  As part of our continuing mission to provide you with exceptional heart care, we have created designated Provider Care Teams.  These Care Teams include your primary Cardiologist (physician) and Advanced Practice Providers (APPs -  Physician Assistants and Nurse Practitioners) who all work together to provide you with the care you need, when you need it.  We recommend signing up for the patient portal called "MyChart".  Sign up information is provided on this After Visit Summary.  MyChart is used to connect with patients for Virtual Visits (Telemedicine).  Patients are able to view lab/test results, encounter notes, upcoming appointments, etc.   Non-urgent messages can be sent to your provider as well.   To learn more about what you can do with MyChart, go to [email or web address].    Your next appointment:   2 month(s)  The format for your next appointment:   In Person  Provider:   Dr. Antoine Poche  Other Instructions Please look into buying an [employer] blood pressure cuff  Important Information About Sugar

## 2021-08-06 LAB — TSH: TSH: 0.777 u[IU]/mL (ref 0.450–4.500)

## 2021-08-06 LAB — BRAIN NATRIURETIC PEPTIDE: BNP: 43.8 pg/mL (ref 0.0–100.0)

## 2021-08-12 DIAGNOSIS — G4733 Obstructive sleep apnea (adult) (pediatric): Secondary | ICD-10-CM | POA: Diagnosis not present

## 2021-08-16 ENCOUNTER — Ambulatory Visit (HOSPITAL_COMMUNITY): Payer: BC Managed Care – PPO | Attending: Internal Medicine

## 2021-08-16 DIAGNOSIS — I361 Nonrheumatic tricuspid (valve) insufficiency: Secondary | ICD-10-CM

## 2021-08-16 DIAGNOSIS — R002 Palpitations: Secondary | ICD-10-CM | POA: Diagnosis not present

## 2021-08-16 LAB — ECHOCARDIOGRAM COMPLETE
AR max vel: 2.78 cm2
AV Area VTI: 3.18 cm2
AV Area mean vel: 3.09 cm2
AV Mean grad: 4 mmHg
AV Peak grad: 8.4 mmHg
Ao pk vel: 1.45 m/s
Area-P 1/2: 4.46 cm2
S' Lateral: 2.6 cm

## 2021-08-23 ENCOUNTER — Encounter: Payer: Self-pay | Admitting: Cardiology

## 2021-09-12 DIAGNOSIS — G4733 Obstructive sleep apnea (adult) (pediatric): Secondary | ICD-10-CM | POA: Diagnosis not present

## 2021-09-13 DIAGNOSIS — I10 Essential (primary) hypertension: Secondary | ICD-10-CM | POA: Diagnosis not present

## 2021-09-13 DIAGNOSIS — Z87898 Personal history of other specified conditions: Secondary | ICD-10-CM | POA: Diagnosis not present

## 2021-09-13 DIAGNOSIS — G4700 Insomnia, unspecified: Secondary | ICD-10-CM | POA: Diagnosis not present

## 2021-09-13 DIAGNOSIS — G4733 Obstructive sleep apnea (adult) (pediatric): Secondary | ICD-10-CM | POA: Diagnosis not present

## 2021-09-14 DIAGNOSIS — G4733 Obstructive sleep apnea (adult) (pediatric): Secondary | ICD-10-CM | POA: Diagnosis not present

## 2021-09-16 DIAGNOSIS — F331 Major depressive disorder, recurrent, moderate: Secondary | ICD-10-CM | POA: Diagnosis not present

## 2021-09-16 DIAGNOSIS — F411 Generalized anxiety disorder: Secondary | ICD-10-CM | POA: Diagnosis not present

## 2021-09-16 DIAGNOSIS — I10 Essential (primary) hypertension: Secondary | ICD-10-CM | POA: Diagnosis not present

## 2021-09-16 DIAGNOSIS — G4700 Insomnia, unspecified: Secondary | ICD-10-CM | POA: Diagnosis not present

## 2021-09-20 DIAGNOSIS — I10 Essential (primary) hypertension: Secondary | ICD-10-CM | POA: Diagnosis not present

## 2021-09-21 DIAGNOSIS — I10 Essential (primary) hypertension: Secondary | ICD-10-CM | POA: Diagnosis not present

## 2021-09-21 DIAGNOSIS — F419 Anxiety disorder, unspecified: Secondary | ICD-10-CM | POA: Diagnosis not present

## 2021-09-21 DIAGNOSIS — R110 Nausea: Secondary | ICD-10-CM | POA: Diagnosis not present

## 2021-09-21 DIAGNOSIS — R0602 Shortness of breath: Secondary | ICD-10-CM | POA: Diagnosis not present

## 2021-09-27 DIAGNOSIS — I10 Essential (primary) hypertension: Secondary | ICD-10-CM | POA: Diagnosis not present

## 2021-09-27 DIAGNOSIS — F411 Generalized anxiety disorder: Secondary | ICD-10-CM | POA: Diagnosis not present

## 2021-09-27 DIAGNOSIS — F331 Major depressive disorder, recurrent, moderate: Secondary | ICD-10-CM | POA: Diagnosis not present

## 2021-09-27 DIAGNOSIS — R0602 Shortness of breath: Secondary | ICD-10-CM | POA: Diagnosis not present

## 2021-10-04 DIAGNOSIS — I10 Essential (primary) hypertension: Secondary | ICD-10-CM | POA: Insufficient documentation

## 2021-10-04 DIAGNOSIS — R0602 Shortness of breath: Secondary | ICD-10-CM | POA: Insufficient documentation

## 2021-10-04 DIAGNOSIS — I471 Supraventricular tachycardia: Secondary | ICD-10-CM | POA: Insufficient documentation

## 2021-10-04 NOTE — Progress Notes (Unsigned)
Cardiology Office Note   Date:  10/06/2021   ID:  Amanda Holt, DOB 1970-05-19, MRN [id number]  PCP:  Lewis Moccasin, MD  Cardiologist:   None Referring:  Lewis Moccasin, MD  Chief Complaint  Patient presents with   Shortness of Breath      History of Present Illness: Amanda Holt is a 51 y.o. female who is referred by Lewis Moccasin, MD for evaluation of palpitations.  She has a history of atrial fibrillation/flutter.  I do see some notes from [facility] where she has had her follow-up.  She has had ablation and mapping and she thinks this was around 2014 for her atrial fibrillation.   She had SOB when I first saw her.  BNP was normal.    Echocardiogram demonstrated no acute abnormalities.  She presents for follow-up.  She says her blood pressure is better and she was being managed by her primary provider who put her on Diovan.  She has been having some brief episodes of palpitations that she controls with Valsalva.  She is otherwise feeling relatively well.  She is lost a little weight.  She has not been walking because of the extreme heat.  However, with her activity she is not bringing on any chest pressure, neck or arm discomfort.  She is not having any PND or orthopnea.  She still has episodic shortness of breath.  Some of this is at night.  This may happen sporadically during the day as well.  It happens if she goes outside but it can also happen inside.  This is similar to what was discussed previously.   Past Medical History:  Diagnosis Date   Anemia    Anxiety    Atrial fibrillation (HCC)    Ablation   Atrial flutter (HCC)    HTN (hypertension)    Hx of diverticulitis of colon    Hypoglycemia    Obstructive sleep apnea on CPAP     Past Surgical History:  Procedure Laterality Date   APPENDECTOMY     cesarian section       Current Outpatient Medications  Medication Sig Dispense Refill   carvedilol (COREG) 3.125 MG tablet Take 3.125 mg by mouth 2 (two)  times daily.     ECHINACEA PO Take 1 capsule by mouth daily as needed (for cold symptoms.).     Famotidine (PEPCID AC PO) Take 1 tablet by mouth at bedtime.     ibuprofen (ADVIL) 600 MG tablet Take 600 mg by mouth every 6 (six) hours as needed.     sertraline (ZOLOFT) 25 MG tablet Take 25 mg by mouth every morning.     valsartan (DIOVAN) 40 MG tablet Take 40 mg by mouth daily.     vitamin C (ASCORBIC ACID) 500 MG tablet Take 500 mg by mouth daily.     Vitamin D, Ergocalciferol, (DRISDOL) 1.25 MG (50000 UNIT) CAPS capsule Take 50,000 Units by mouth once a week.     No current facility-administered medications for this visit.    Allergies:   Barbiturates and Codeine    ROS:  Please see the history of present illness.   Otherwise, review of systems are positive for none.   All other systems are reviewed and negative.    PHYSICAL EXAM: VS:  BP 131/81   Pulse 69   Ht 5\' 8"  (1.727 m)   Wt 224 lb 9.6 oz (101.9 kg)   SpO2 100%   BMI 34.15 kg/m  ,  BMI Body mass index is 34.15 kg/m. GENERAL:  Well appearing NECK:  No jugular venous distention, waveform within normal limits, carotid upstroke brisk and symmetric, no bruits, no thyromegaly LUNGS:  Clear to auscultation bilaterally CHEST:  Unremarkable HEART:  PMI not displaced or sustained,S1 and S2 within normal limits, no S3, no S4, no clicks, no rubs, no murmurs ABD:  Flat, positive bowel sounds normal in frequency in pitch, no bruits, no rebound, no guarding, no midline pulsatile mass, no hepatomegaly, no splenomegaly EXT:  2 plus pulses throughout, no edema, no cyanosis no clubbing   EKG:  EKG is not ordered today.    Recent Labs: 01/04/2021: ALT 21; BUN 13; Creatinine, Ser 0.64; Hemoglobin 13.0; Platelets 352; Potassium 3.3; Sodium 138 08/05/2021: BNP 43.8; TSH 0.777    Lipid Panel No results found for: "CHOL", "TRIG", "HDL", "CHOLHDL", "VLDL", "LDLCALC", "LDLDIRECT"    Wt Readings from Last 3 Encounters:  10/06/21 224 lb 9.6  oz (101.9 kg)  08/05/21 229 lb 12.8 oz (104.2 kg)  08/02/21 227 lb (103 kg)      Other studies Reviewed: Additional studies/ records that were reviewed today include: None Review of the above records demonstrates:  Please see elsewhere in the note.     ASSESSMENT AND PLAN:  Atrial fib:   She has a very low thromboembolic risk and low burden apparently of fibrillation.  No anticoagulation is indicated.   SVT:   She had this noted previously on monitor but she is able to control it and does not want to consider any further treatment.  She would let me know if they intensify and we could consider ablation.   HTN:   Her blood pressure is controlled.  She will continue the meds as listed.  Shortness of breath: She still having some episodic shortness of breath without a clear etiology.  For completeness I will send her for pulmonary function testing.   Current medicines are reviewed at length with the patient today.  The patient does not have concerns regarding medicines.  The following changes have been made: As above  Labs/ tests ordered today include:    Orders Placed This Encounter  Procedures   Pulmonary function test     Disposition:   Follow up with me in one year.    Signed, Rollene Rotunda, MD  10/06/2021 3:36 PM    [facility]

## 2021-10-06 ENCOUNTER — Encounter: Payer: Self-pay | Admitting: Cardiology

## 2021-10-06 ENCOUNTER — Ambulatory Visit: Payer: BC Managed Care – PPO | Attending: Cardiology | Admitting: Cardiology

## 2021-10-06 VITALS — BP 131/81 | HR 69 | Ht 68.0 in | Wt 224.6 lb

## 2021-10-06 DIAGNOSIS — I10 Essential (primary) hypertension: Secondary | ICD-10-CM

## 2021-10-06 DIAGNOSIS — R0602 Shortness of breath: Secondary | ICD-10-CM | POA: Diagnosis not present

## 2021-10-06 DIAGNOSIS — I4891 Unspecified atrial fibrillation: Secondary | ICD-10-CM | POA: Diagnosis not present

## 2021-10-06 DIAGNOSIS — I471 Supraventricular tachycardia: Secondary | ICD-10-CM

## 2021-10-06 NOTE — Patient Instructions (Signed)
Medication Instructions:  Continue same medications *If you need a refill on your cardiac medications before your next appointment, please call your pharmacy*   Lab Work: None ordered   Testing/Procedures: Schedule PFT's   Follow-Up: At [facility], you and your health needs are our priority.  As part of our continuing mission to provide you with exceptional heart care, we have created designated Provider Care Teams.  These Care Teams include your primary Cardiologist (physician) and Advanced Practice Providers (APPs -  Physician Assistants and Nurse Practitioners) who all work together to provide you with the care you need, when you need it.  We recommend signing up for the patient portal called "MyChart".  Sign up information is provided on this After Visit Summary.  MyChart is used to connect with patients for Virtual Visits (Telemedicine).  Patients are able to view lab/test results, encounter notes, upcoming appointments, etc.  Non-urgent messages can be sent to your provider as well.   To learn more about what you can do with MyChart, go to [email or web address].    Your next appointment: 1 year    Call in June to schedule Sept appointment      The format for your next appointment: Office    Provider:  Dr.Hochrein   Important Information About Sugar

## 2021-10-12 DIAGNOSIS — E559 Vitamin D deficiency, unspecified: Secondary | ICD-10-CM | POA: Diagnosis not present

## 2021-10-12 DIAGNOSIS — Z114 Encounter for screening for human immunodeficiency virus [HIV]: Secondary | ICD-10-CM | POA: Diagnosis not present

## 2021-10-12 DIAGNOSIS — Z0000 Encounter for general adult medical examination without abnormal findings: Secondary | ICD-10-CM | POA: Diagnosis not present

## 2021-10-13 DIAGNOSIS — L400 Psoriasis vulgaris: Secondary | ICD-10-CM | POA: Diagnosis not present

## 2021-10-13 DIAGNOSIS — G4733 Obstructive sleep apnea (adult) (pediatric): Secondary | ICD-10-CM | POA: Diagnosis not present

## 2021-10-14 DIAGNOSIS — Z0000 Encounter for general adult medical examination without abnormal findings: Secondary | ICD-10-CM | POA: Diagnosis not present

## 2021-10-14 DIAGNOSIS — Z118 Encounter for screening for other infectious and parasitic diseases: Secondary | ICD-10-CM | POA: Diagnosis not present

## 2021-10-14 DIAGNOSIS — Z01419 Encounter for gynecological examination (general) (routine) without abnormal findings: Secondary | ICD-10-CM | POA: Diagnosis not present

## 2021-10-14 DIAGNOSIS — Z1211 Encounter for screening for malignant neoplasm of colon: Secondary | ICD-10-CM | POA: Diagnosis not present

## 2021-10-14 DIAGNOSIS — Z23 Encounter for immunization: Secondary | ICD-10-CM | POA: Diagnosis not present

## 2021-10-14 DIAGNOSIS — Z1151 Encounter for screening for human papillomavirus (HPV): Secondary | ICD-10-CM | POA: Diagnosis not present

## 2021-10-14 DIAGNOSIS — Z113 Encounter for screening for infections with a predominantly sexual mode of transmission: Secondary | ICD-10-CM | POA: Diagnosis not present

## 2021-10-15 DIAGNOSIS — F411 Generalized anxiety disorder: Secondary | ICD-10-CM | POA: Diagnosis not present

## 2021-10-15 DIAGNOSIS — M542 Cervicalgia: Secondary | ICD-10-CM | POA: Diagnosis not present

## 2021-10-15 DIAGNOSIS — I10 Essential (primary) hypertension: Secondary | ICD-10-CM | POA: Diagnosis not present

## 2021-10-18 DIAGNOSIS — E559 Vitamin D deficiency, unspecified: Secondary | ICD-10-CM | POA: Diagnosis not present

## 2021-10-18 DIAGNOSIS — E782 Mixed hyperlipidemia: Secondary | ICD-10-CM | POA: Diagnosis not present

## 2021-10-18 DIAGNOSIS — I10 Essential (primary) hypertension: Secondary | ICD-10-CM | POA: Diagnosis not present

## 2021-10-26 ENCOUNTER — Ambulatory Visit (HOSPITAL_COMMUNITY)
Admission: RE | Admit: 2021-10-26 | Discharge: 2021-10-26 | Disposition: A | Discharge status: Home / Self Care | Payer: BC Managed Care – PPO | Source: Ambulatory Visit | Attending: Cardiology | Admitting: Cardiology

## 2021-10-26 DIAGNOSIS — R0602 Shortness of breath: Secondary | ICD-10-CM | POA: Insufficient documentation

## 2021-10-26 DIAGNOSIS — I10 Essential (primary) hypertension: Secondary | ICD-10-CM | POA: Insufficient documentation

## 2021-10-26 DIAGNOSIS — I471 Supraventricular tachycardia: Secondary | ICD-10-CM | POA: Insufficient documentation

## 2021-10-26 DIAGNOSIS — I4891 Unspecified atrial fibrillation: Secondary | ICD-10-CM | POA: Insufficient documentation

## 2021-10-26 LAB — PULMONARY FUNCTION TEST
DL/VA % pred: 91 %
DL/VA: 3.83 ml/min/mmHg/L
DLCO unc % pred: 85 %
DLCO unc: 21 ml/min/mmHg
FEF 25-75 Pre: 2.12 L/sec
FEF2575-%Pred-Pre: 68 %
FEV1-%Pred-Pre: 88 %
FEV1-Pre: 2.91 L
FEV1FVC-%Pred-Pre: 91 %
FEV6-%Pred-Pre: 97 %
FEV6-Pre: 3.94 L
FEV6FVC-%Pred-Pre: 101 %
FVC-%Pred-Pre: 95 %
FVC-Pre: 3.97 L
Pre FEV1/FVC ratio: 74 %
Pre FEV6/FVC Ratio: 99 %
RV % pred: 120 %
RV: 2.37 L
TLC % pred: 109 %
TLC: 6.3 L

## 2021-10-26 MED ORDER — ALBUTEROL SULFATE (2.5 MG/3ML) 0.083% IN NEBU: 2.5000 mg | INHALATION_SOLUTION | Freq: Once | RESPIRATORY_TRACT | Status: DC

## 2021-11-03 DIAGNOSIS — F411 Generalized anxiety disorder: Secondary | ICD-10-CM | POA: Diagnosis not present

## 2021-11-03 DIAGNOSIS — I10 Essential (primary) hypertension: Secondary | ICD-10-CM | POA: Diagnosis not present

## 2021-11-03 DIAGNOSIS — G4700 Insomnia, unspecified: Secondary | ICD-10-CM | POA: Diagnosis not present

## 2021-11-03 DIAGNOSIS — F331 Major depressive disorder, recurrent, moderate: Secondary | ICD-10-CM | POA: Diagnosis not present

## 2021-11-12 DIAGNOSIS — G4733 Obstructive sleep apnea (adult) (pediatric): Secondary | ICD-10-CM | POA: Diagnosis not present

## 2021-12-13 DIAGNOSIS — G4733 Obstructive sleep apnea (adult) (pediatric): Secondary | ICD-10-CM | POA: Diagnosis not present

## 2022-01-07 DIAGNOSIS — Z91148 Patient's other noncompliance with medication regimen for other reason: Secondary | ICD-10-CM | POA: Diagnosis not present

## 2022-01-07 DIAGNOSIS — R0789 Other chest pain: Secondary | ICD-10-CM | POA: Diagnosis not present

## 2022-01-07 DIAGNOSIS — K219 Gastro-esophageal reflux disease without esophagitis: Secondary | ICD-10-CM | POA: Diagnosis not present

## 2022-01-12 DIAGNOSIS — G4733 Obstructive sleep apnea (adult) (pediatric): Secondary | ICD-10-CM | POA: Diagnosis not present

## 2022-01-17 DIAGNOSIS — I10 Essential (primary) hypertension: Secondary | ICD-10-CM | POA: Diagnosis not present

## 2022-01-17 DIAGNOSIS — E559 Vitamin D deficiency, unspecified: Secondary | ICD-10-CM | POA: Diagnosis not present

## 2022-01-17 DIAGNOSIS — E782 Mixed hyperlipidemia: Secondary | ICD-10-CM | POA: Diagnosis not present

## 2022-01-17 DIAGNOSIS — R7303 Prediabetes: Secondary | ICD-10-CM | POA: Diagnosis not present

## 2022-01-20 DIAGNOSIS — F411 Generalized anxiety disorder: Secondary | ICD-10-CM | POA: Diagnosis not present

## 2022-01-20 DIAGNOSIS — E782 Mixed hyperlipidemia: Secondary | ICD-10-CM | POA: Diagnosis not present

## 2022-01-20 DIAGNOSIS — E559 Vitamin D deficiency, unspecified: Secondary | ICD-10-CM | POA: Diagnosis not present

## 2022-01-20 DIAGNOSIS — R7303 Prediabetes: Secondary | ICD-10-CM | POA: Diagnosis not present

## 2022-02-07 DIAGNOSIS — G4733 Obstructive sleep apnea (adult) (pediatric): Secondary | ICD-10-CM | POA: Diagnosis not present

## 2022-02-08 DIAGNOSIS — K219 Gastro-esophageal reflux disease without esophagitis: Secondary | ICD-10-CM | POA: Diagnosis not present

## 2022-02-12 DIAGNOSIS — G4733 Obstructive sleep apnea (adult) (pediatric): Secondary | ICD-10-CM | POA: Diagnosis not present

## 2022-02-15 DIAGNOSIS — D225 Melanocytic nevi of trunk: Secondary | ICD-10-CM | POA: Diagnosis not present

## 2022-02-15 DIAGNOSIS — L400 Psoriasis vulgaris: Secondary | ICD-10-CM | POA: Diagnosis not present

## 2022-02-15 DIAGNOSIS — L821 Other seborrheic keratosis: Secondary | ICD-10-CM | POA: Diagnosis not present

## 2022-02-15 DIAGNOSIS — L814 Other melanin hyperpigmentation: Secondary | ICD-10-CM | POA: Diagnosis not present

## 2022-02-15 DIAGNOSIS — R238 Other skin changes: Secondary | ICD-10-CM | POA: Diagnosis not present

## 2022-02-15 DIAGNOSIS — B078 Other viral warts: Secondary | ICD-10-CM | POA: Diagnosis not present

## 2022-03-07 DIAGNOSIS — G4700 Insomnia, unspecified: Secondary | ICD-10-CM | POA: Diagnosis not present

## 2022-03-07 DIAGNOSIS — F411 Generalized anxiety disorder: Secondary | ICD-10-CM | POA: Diagnosis not present

## 2022-03-07 DIAGNOSIS — Z87898 Personal history of other specified conditions: Secondary | ICD-10-CM | POA: Diagnosis not present

## 2022-03-07 DIAGNOSIS — F331 Major depressive disorder, recurrent, moderate: Secondary | ICD-10-CM | POA: Diagnosis not present

## 2022-03-20 IMAGING — US US PELVIS COMPLETE WITH TRANSVAGINAL
1 series · 13 of 25 positions shown · non-contrast
Comparison: None available

CLINICAL DATA: On and off pelvic pain for 1 month. Part of 1 ovary
removed for a cyst. LMP 01/12/2021.

EXAM:
TRANSABDOMINAL AND TRANSVAGINAL ULTRASOUND OF PELVIS
TECHNIQUE: Both transabdominal and transvaginal ultrasound examinations of the
pelvis were performed. Transabdominal technique was performed for
global imaging of the pelvis including uterus, ovaries, adnexal
regions, and pelvic cul-de-sac. It was necessary to proceed with
endovaginal exam following the transabdominal exam to visualize the
uterus, endometrium, ovaries, and adnexal regions.

[Series 1: us pelvis complete with transvaginal · 0.33mm/px · 48 acquisitions, 13 frames shown]
[im 1/48]
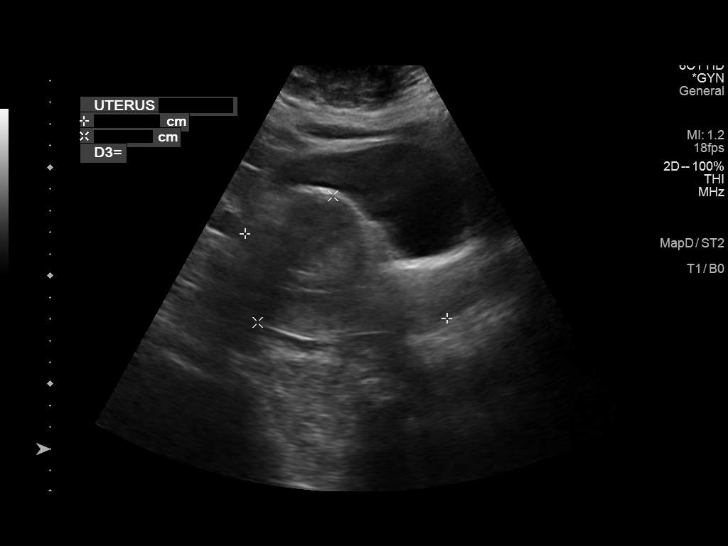
[im 4/48]
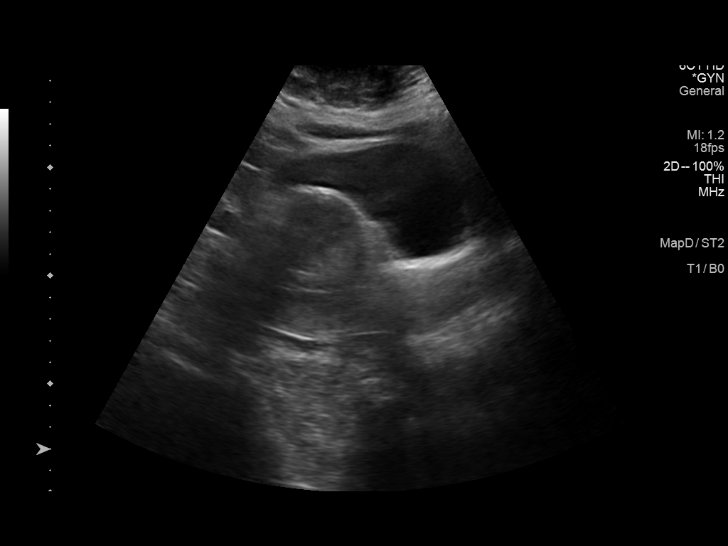
[im 8/48]
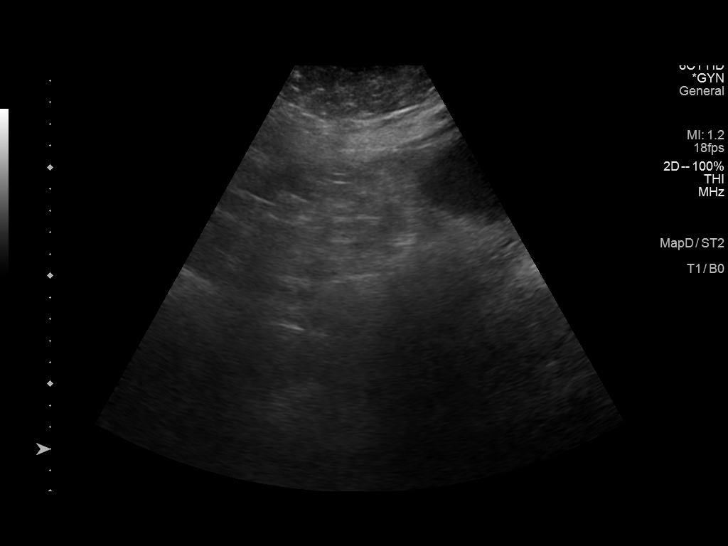
[im 12/48]
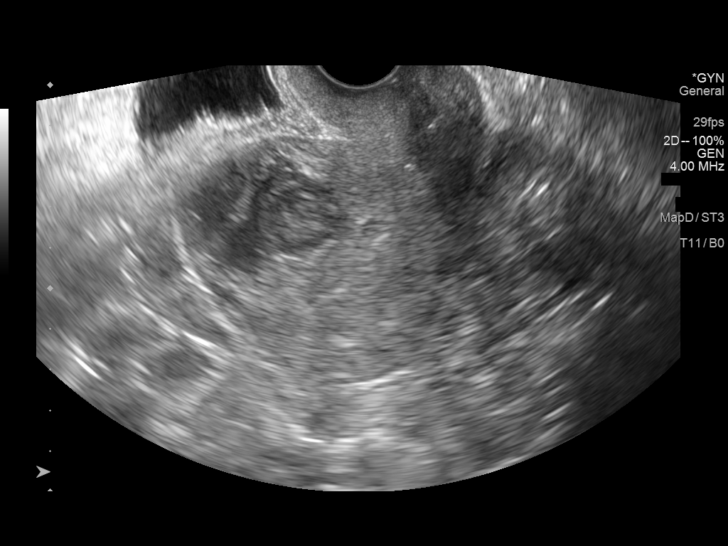
[im 16/48]
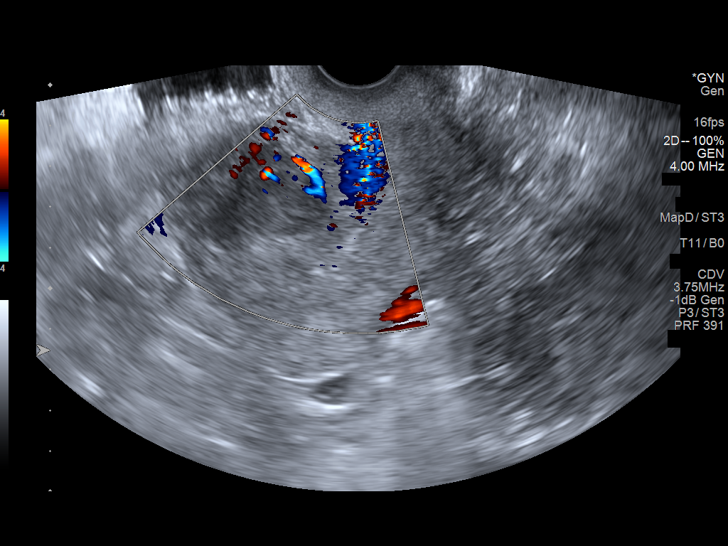
[im 20/48]
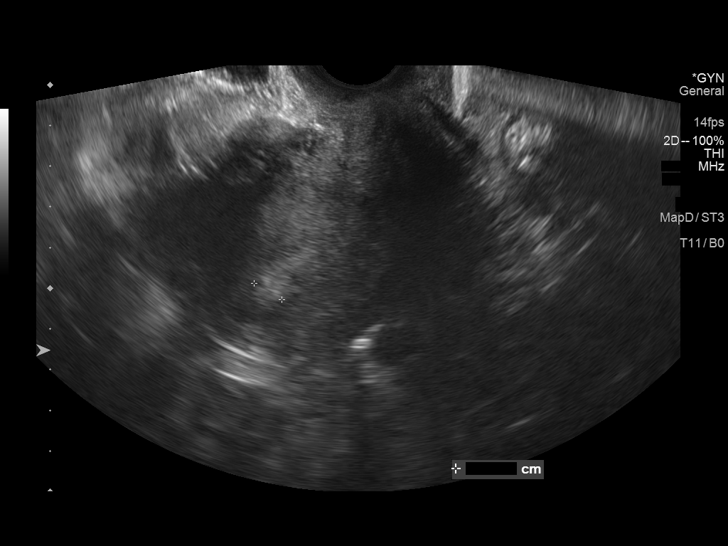
[im 24/48]
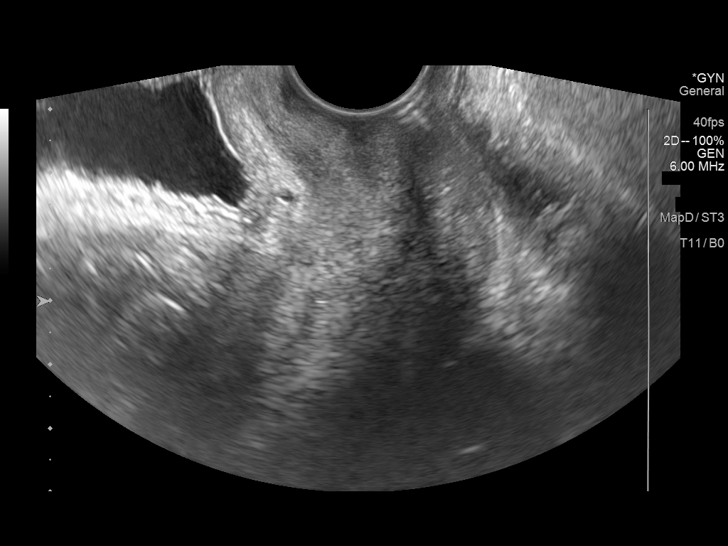
[im 28/48]
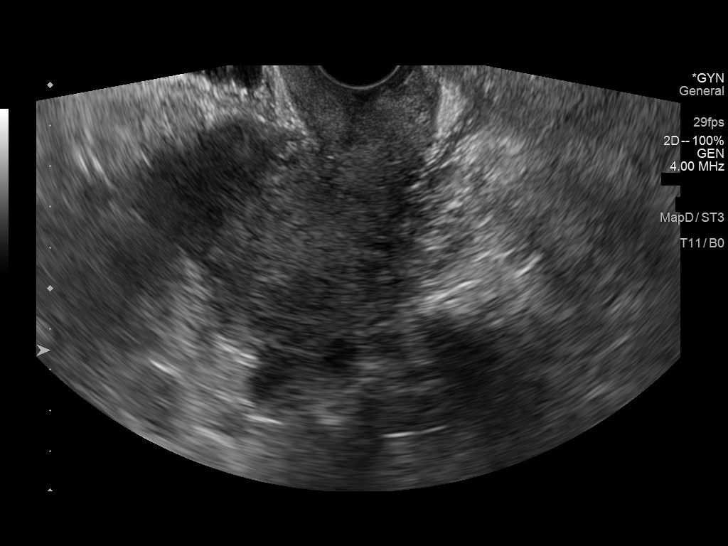
[im 32/48]
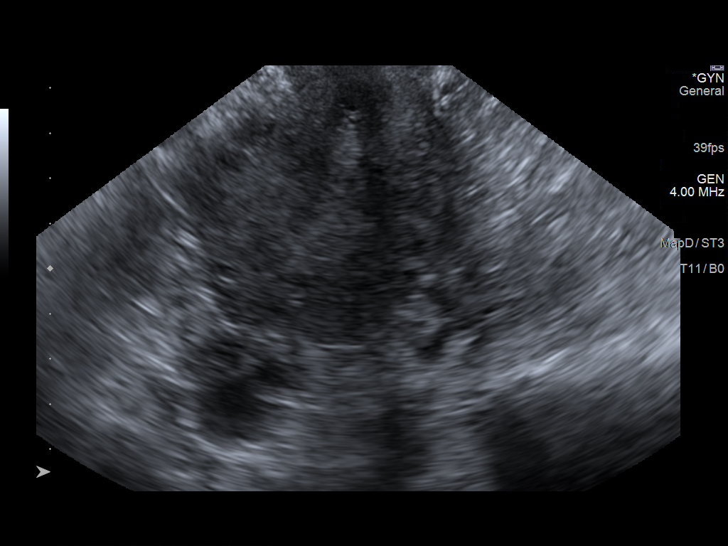
[im 36/48]
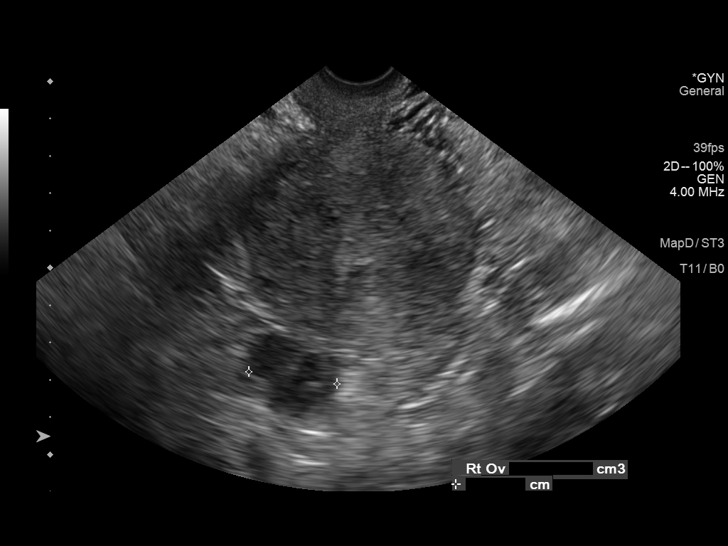
[im 40/48]
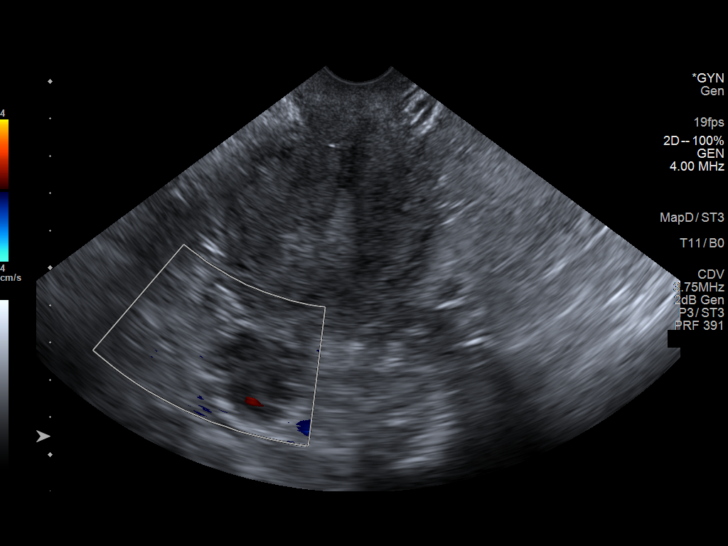
[im 44/48]
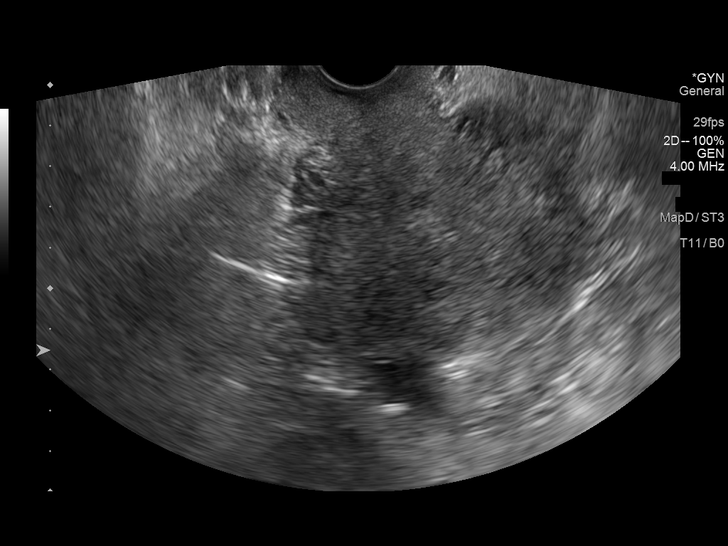
[im 48/48]
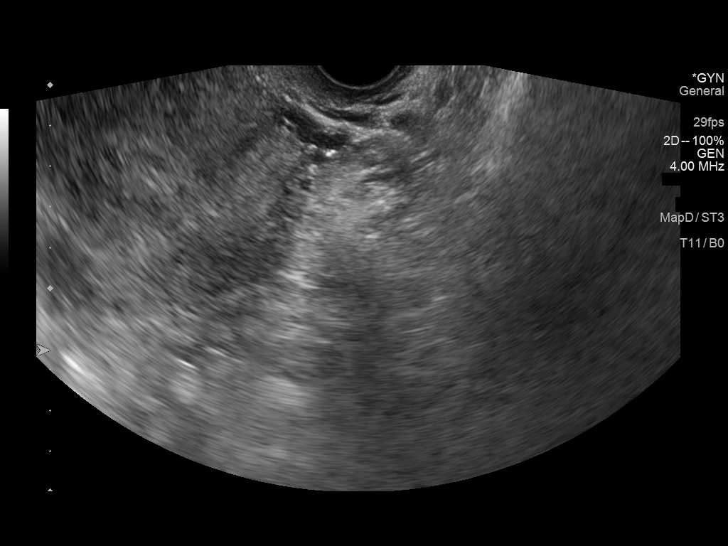

[13 of 25 positions shown; findings below may reference images not displayed]

FINDINGS: Uterus

Measurements: 10.1 x 6.8 x 6.4 centimeters = volume: 229.7 mL. No
fibroids or other mass visualized. Uterus is mildly heterogeneous.
Fundal fibroid is 3.6 x 2.5 x 3.1 centimeters.

Endometrium

Thickness: 7.9 millimeters.  No focal abnormality visualized.

Right ovary

Measurements: 3.0 x 2.6 x 2.4 centimeters = volume: 9.5 mL. Normal
appearance/no adnexal mass.

Left ovary

Measurements: The ovary is not visualized, either absent or
obscured.

Other findings

No free pelvic fluid.
IMPRESSION: 1. Normal appearance of the uterus and endometrium.
2. Unremarkable appearance of the RIGHT ovary.
3. LEFT ovary is not seen.

## 2022-04-04 DIAGNOSIS — N390 Urinary tract infection, site not specified: Secondary | ICD-10-CM | POA: Diagnosis not present

## 2022-04-04 DIAGNOSIS — R109 Unspecified abdominal pain: Secondary | ICD-10-CM | POA: Diagnosis not present

## 2022-04-04 DIAGNOSIS — N3001 Acute cystitis with hematuria: Secondary | ICD-10-CM | POA: Diagnosis not present

## 2022-04-04 DIAGNOSIS — N3000 Acute cystitis without hematuria: Secondary | ICD-10-CM | POA: Diagnosis not present

## 2022-04-06 DIAGNOSIS — M545 Low back pain, unspecified: Secondary | ICD-10-CM | POA: Diagnosis not present

## 2022-04-06 DIAGNOSIS — H6091 Unspecified otitis externa, right ear: Secondary | ICD-10-CM | POA: Diagnosis not present

## 2022-04-06 DIAGNOSIS — R109 Unspecified abdominal pain: Secondary | ICD-10-CM | POA: Diagnosis not present

## 2022-04-12 DIAGNOSIS — E782 Mixed hyperlipidemia: Secondary | ICD-10-CM | POA: Diagnosis not present

## 2022-04-12 DIAGNOSIS — I10 Essential (primary) hypertension: Secondary | ICD-10-CM | POA: Diagnosis not present

## 2022-04-12 DIAGNOSIS — R7303 Prediabetes: Secondary | ICD-10-CM | POA: Diagnosis not present

## 2022-04-15 DIAGNOSIS — R519 Headache, unspecified: Secondary | ICD-10-CM | POA: Diagnosis not present

## 2022-04-19 ENCOUNTER — Other Ambulatory Visit: Payer: Self-pay | Admitting: Nurse Practitioner

## 2022-04-19 DIAGNOSIS — H539 Unspecified visual disturbance: Secondary | ICD-10-CM

## 2022-04-19 DIAGNOSIS — E785 Hyperlipidemia, unspecified: Secondary | ICD-10-CM

## 2022-04-19 DIAGNOSIS — I10 Essential (primary) hypertension: Secondary | ICD-10-CM

## 2022-04-19 DIAGNOSIS — E782 Mixed hyperlipidemia: Secondary | ICD-10-CM | POA: Diagnosis not present

## 2022-04-19 DIAGNOSIS — R519 Headache, unspecified: Secondary | ICD-10-CM

## 2022-04-19 DIAGNOSIS — R7303 Prediabetes: Secondary | ICD-10-CM | POA: Diagnosis not present

## 2022-05-12 ENCOUNTER — Other Ambulatory Visit: Payer: BC Managed Care – PPO

## 2022-05-13 ENCOUNTER — Ambulatory Visit
Admission: RE | Admit: 2022-05-13 | Discharge: 2022-05-13 | Disposition: A | Discharge status: Home / Self Care | Payer: BC Managed Care – PPO | Source: Ambulatory Visit | Attending: Nurse Practitioner | Admitting: Nurse Practitioner

## 2022-05-13 DIAGNOSIS — R519 Headache, unspecified: Secondary | ICD-10-CM

## 2022-05-13 DIAGNOSIS — H539 Unspecified visual disturbance: Secondary | ICD-10-CM

## 2022-05-13 DIAGNOSIS — I10 Essential (primary) hypertension: Secondary | ICD-10-CM | POA: Diagnosis not present

## 2022-05-13 DIAGNOSIS — E785 Hyperlipidemia, unspecified: Secondary | ICD-10-CM | POA: Diagnosis not present

## 2022-05-25 DIAGNOSIS — F411 Generalized anxiety disorder: Secondary | ICD-10-CM | POA: Diagnosis not present

## 2022-05-25 DIAGNOSIS — G4700 Insomnia, unspecified: Secondary | ICD-10-CM | POA: Diagnosis not present

## 2022-05-25 DIAGNOSIS — F331 Major depressive disorder, recurrent, moderate: Secondary | ICD-10-CM | POA: Diagnosis not present

## 2022-06-09 ENCOUNTER — Ambulatory Visit: Payer: BC Managed Care – PPO | Admitting: Podiatry

## 2022-06-09 ENCOUNTER — Encounter: Payer: Self-pay | Admitting: Podiatry

## 2022-06-09 DIAGNOSIS — G629 Polyneuropathy, unspecified: Secondary | ICD-10-CM

## 2022-06-09 DIAGNOSIS — G4700 Insomnia, unspecified: Secondary | ICD-10-CM | POA: Diagnosis not present

## 2022-06-09 DIAGNOSIS — M2042 Other hammer toe(s) (acquired), left foot: Secondary | ICD-10-CM | POA: Diagnosis not present

## 2022-06-09 DIAGNOSIS — L84 Corns and callosities: Secondary | ICD-10-CM | POA: Diagnosis not present

## 2022-06-09 DIAGNOSIS — F331 Major depressive disorder, recurrent, moderate: Secondary | ICD-10-CM | POA: Diagnosis not present

## 2022-06-09 DIAGNOSIS — F411 Generalized anxiety disorder: Secondary | ICD-10-CM | POA: Diagnosis not present

## 2022-06-09 NOTE — Progress Notes (Signed)
Subjective:   Patient ID: Elvis Coil, female   DOB: 52 y.o.   MRN: [id number]   HPI Patient states she has had numbness in both her feet for a number of years has been to neurologist no identification of the problem does not get tingling burning but it is aggravating.  Also complains about fourth digit irritation left over right foot states it feels like something stuck into it and does get some nail discoloration.  Patient does not smoke tries to be active   Review of Systems  All other systems reviewed and are negative.       Objective:  Physical Exam Vitals and nursing note reviewed.  Constitutional:      Appearance: She is well-developed.  Pulmonary:     Effort: Pulmonary effort is normal.  Musculoskeletal:        General: Normal range of motion.  Skin:    General: Skin is warm.  Neurological:     Mental Status: She is alert.     Vascular status found to be intact and found to have marked loss of sharp dull vibratory sensation bilateral.  Patient is noted to have significant rotation digit for both feet with distal keratotic lesion digit 4 left painful and is noted to have nail disease with thickness 1-5 both feet.  Good digital perfusion well oriented     Assessment:  Idiopathic neuropathy bilateral with digital deformity fourth left distal with keratotic lesion formation that is painful with rotational component of the toe consistent with hammertoe     Plan:  H&P all conditions reviewed and treatment currently for neuropathy as I do not see anything that would be of value Sharp sterile debridement of lesion distal fourth toe left with cushioning and discussed daily inspections of this and if it were to get worse we will have to consider other treatment plan no other current treatment recommended

## 2022-06-09 NOTE — Patient Instructions (Signed)

## 2022-06-15 DIAGNOSIS — N760 Acute vaginitis: Secondary | ICD-10-CM | POA: Diagnosis not present

## 2022-06-15 DIAGNOSIS — N390 Urinary tract infection, site not specified: Secondary | ICD-10-CM | POA: Diagnosis not present

## 2022-06-15 DIAGNOSIS — N3000 Acute cystitis without hematuria: Secondary | ICD-10-CM | POA: Diagnosis not present

## 2022-06-15 DIAGNOSIS — B9689 Other specified bacterial agents as the cause of diseases classified elsewhere: Secondary | ICD-10-CM | POA: Diagnosis not present

## 2022-07-28 DIAGNOSIS — G4700 Insomnia, unspecified: Secondary | ICD-10-CM | POA: Diagnosis not present

## 2022-07-28 DIAGNOSIS — F411 Generalized anxiety disorder: Secondary | ICD-10-CM | POA: Diagnosis not present

## 2022-07-28 DIAGNOSIS — F331 Major depressive disorder, recurrent, moderate: Secondary | ICD-10-CM | POA: Diagnosis not present

## 2022-07-28 DIAGNOSIS — K529 Noninfective gastroenteritis and colitis, unspecified: Secondary | ICD-10-CM | POA: Diagnosis not present

## 2022-08-08 ENCOUNTER — Encounter: Payer: Self-pay | Admitting: Family Medicine

## 2022-08-08 ENCOUNTER — Ambulatory Visit: Payer: BC Managed Care – PPO | Admitting: Family Medicine

## 2022-08-08 VITALS — BP 124/65 | HR 84 | Ht 68.5 in | Wt 228.6 lb

## 2022-08-08 DIAGNOSIS — G4733 Obstructive sleep apnea (adult) (pediatric): Secondary | ICD-10-CM

## 2022-08-08 NOTE — Progress Notes (Signed)
PATIENT: Amanda Holt DOB: January 06, 1971  REASON FOR VISIT: follow up HISTORY FROM: patient  Chief Complaint  Patient presents with   Follow-up    Pt in room 2, here for Cpap follow up. Pt reports doing well, does report fatigue,and dry mouth. Pt said she does have trouble sleeping, waking up during the night. Not cpap related.     HISTORY OF PRESENT ILLNESS:  08/08/22 ALL:  Amanda Holt returns for follow up for OSA on CPAP. She was last seen 07/2021 by Dr Dohmeier after receiving her new CPAP machine. She continues to do well. She is using therapy every night for about 7-8 hours. She denies concerns with machine or supplies. Using nasal pillows. She admits to not replacing as frequently as recommended. She is not bothered by leak. She continues to have insomnia but not interested in sleep aids.     04/05/2021 ALL: Amanda Holt is a 52 y.o. female here today for follow up for OSA on CPAP. She was seen in consult with Dr Vickey Huger 12/2020. She was started on trazodone 25mg  QHS and HST ordered as she would be eligible for new CPAP 03/2021. HST has not been completed. She requests new DME once HST performed and CPAP ordered. Currently using [facility].   She usually goes to bed around 8pm. Usually asleep around 8:30. She wakes around 4-5am. She reports waking up multiple times at night due to dreaming. She did not start trazodone for fear of side effects. She has not tried melatonin or valerian root.     HISTORY: (copied from Dr Dohmeier's previous note)  Amanda Holt is a 52 y.o. -Caucasian female patient was seen here in a sleep consultation  on 12/18/2020 from PCP .  Chief concern according to patient :   Pt has a soon 50 year old CPAP. She had a SS in [place] after being dx with atrial fib. About 2012, and moved here in Jan 22, her refills have expired from previous provider and she is needing to establish locally.  Set up date of [facility] 04/08/16, she had a last sleep study HST- before the new  machine in 2017.    Amanda Holt  has a past medical history of Anemia,  atrial fibrillation, Atrial flutter (HCC), SVT, ablation, diverticulitis of colon, OSA and on CPAP for a decade, and prediabetes, Hypoglycemia.    CPAP dependent.  Loud snoring. Severe apnea.    Sleep relevant medical history: Nocturia  before CPAP, still 2 a night, sleep walker in her youth,  no ENT, no thyroid disease.   Family medical /sleep history: cousins  on CPAP with OSA, sons with sleep walking.    Social history: patient is in school.  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   The patient currently works in an office- 6 AM - 3 Pm, works from home .   Tobacco use- none , quit 15 years ago. Smoked since age 51.   ETOH use - rare ,  Caffeine intake only decaffeinated.  Regular exercise in form of 3-4 times a week- home. .   Hobbies : writing, painting, making soaps.     Sleep habits are as follows: The patient's dinner time is between 4.30 - 6.30 PM. The patient goes to bed at 8 PM and continues to sleep for 6 hours of interrupted sleep, wakes for other than  bathroom breaks, the first time at 12 AM.   The preferred sleep position is left side, with the support of 2 pillows.  Dreams are reportedly frequent/vivid- 3-5 dreams a night.Marland Kitchen  4-5  AM is the usual rise time. The patient wakes up spontaneously at 3.30 AM, when husband leaves for work..  She reports not feeling refreshed or restored in AM, with symptoms such as dry mouth, morning headaches, and residual fatigue.  Naps are taken infrequently, she fights naps- feels worse- lasting from 60-120  minutes and are less refreshing than nocturnal sleep.    REVIEW OF SYSTEMS: Out of a complete 14 system review of symptoms, the patient complains only of the following symptoms, insomnia, frequent dreaming and all other reviewed systems are negative.  ESS:  7/24  ALLERGIES: Allergies  Allergen Reactions   Barbiturates Nausea And Vomiting and Other (See Comments)    Patient states this medication makes her pass out.   Codeine Nausea And Vomiting    HOME MEDICATIONS: Outpatient Medications Prior to Visit  Medication Sig Dispense Refill   carvedilol (COREG) 3.125 MG tablet Take 3.125 mg by mouth 2 (two) times daily.     ECHINACEA PO Take 1 capsule by mouth daily as needed (for cold symptoms.).     Famotidine (PEPCID AC PO) Take 1 tablet by mouth at bedtime.     ibuprofen (ADVIL) 600 MG tablet Take 600 mg by mouth every 6 (six) hours as needed.     sertraline (ZOLOFT) 50 MG tablet Take 50 mg by mouth daily.     valsartan (DIOVAN) 40 MG tablet Take 40 mg by mouth daily.     vitamin C (ASCORBIC ACID) 500 MG tablet Take 500 mg by mouth daily.     Vitamin D, Ergocalciferol, (DRISDOL) 1.25 MG (50000 UNIT) CAPS capsule Take 50,000 Units by mouth once a week.     sertraline (ZOLOFT) 25 MG tablet Take 25 mg by mouth every morning. (Patient not taking: Reported on 08/08/2022)     No facility-administered medications prior to visit.    PAST MEDICAL HISTORY: Past Medical History:  Diagnosis Date   Anemia    Anxiety    Atrial fibrillation (HCC)    Ablation   Atrial flutter (HCC)    HTN (hypertension)    Hx of diverticulitis of colon    Hypoglycemia    Obstructive sleep apnea on CPAP     PAST SURGICAL HISTORY: Past Surgical History:  Procedure Laterality Date   APPENDECTOMY     cesarian section      FAMILY HISTORY: Family History  Problem Relation Age of Onset   Cancer Mother        Gallbladder   Breast cancer Paternal Grandmother 42   Breast cancer Paternal Aunt 60    SOCIAL HISTORY: Social History   Socioeconomic History   Marital status: Married    Spouse name: Not on file   Number of children: Not on file   Years of education: Not on file   Highest education level: Not on file  Occupational History   Not on file   Tobacco Use   Smoking status: Former   Smokeless tobacco: Never  Vaping Use   Vaping Use: Never used  Substance and Sexual Activity   Alcohol use: Yes    Comment: occasional   Drug use: No   Sexual activity: Yes  Other Topics Concern   Not on file  Social History Narrative   Lives with husband.  Four children.  Works at [employer].    Social Determinants of Health   Financial Resource Strain: Not on file  Food Insecurity: Not on file  Transportation Needs: Not on file  Physical Activity: Not on file  Stress: Not on file  Social Connections: Not on file  Intimate Partner Violence: Not on file     PHYSICAL EXAM  Vitals:   08/08/22 1448  BP: 124/65  Pulse: 84  Weight: 228 lb 9.6 oz (103.7 kg)  Height: 5' 8.5" (1.74 m)    Body mass index is 34.25 kg/m.  Generalized: Well developed, in no acute distress  Cardiology: normal rate and rhythm, no murmur noted Respiratory: clear to auscultation bilaterally  Neurological examination  Mentation: Alert oriented to time, place, history taking. Follows all commands speech and language fluent Cranial nerve II-XII: Pupils were equal round reactive to light. Extraocular movements were full, visual field were full  Motor: The motor testing reveals 5 over 5 strength of all 4 extremities. Good symmetric motor tone is noted throughout.  Gait and station: Gait is normal.    DIAGNOSTIC DATA (LABS, IMAGING, TESTING) - I reviewed patient records, labs, notes, testing and imaging myself where available.      No data to display           Lab Results  Component Value Date   WBC 7.6 01/04/2021   HGB 13.0 01/04/2021   HCT 40.3 01/04/2021   MCV 86.7 01/04/2021   PLT 352 01/04/2021      Component Value Date/Time   NA 138 01/04/2021 2137   NA 138 05/02/2014 1523   K 3.3 (L) 01/04/2021 2137   K 3.8 05/02/2014 1523   CL 100 01/04/2021 2137   CL 107 05/02/2014 1523   CO2 28 01/04/2021 2137   CO2 24 05/02/2014 1523   GLUCOSE 170  (H) 01/04/2021 2137   GLUCOSE 110 (H) 05/02/2014 1523   BUN 13 01/04/2021 2137   BUN 10 05/02/2014 1523   CREATININE 0.64 01/04/2021 2137   CREATININE 0.57 05/02/2014 1523   CALCIUM 9.6 01/04/2021 2137   CALCIUM 9.3 05/02/2014 1523   PROT 7.5 01/04/2021 2137   PROT 8.2 (H) 05/02/2014 1523   ALBUMIN 4.2 01/04/2021 2137   ALBUMIN 4.6 05/02/2014 1523   AST 18 01/04/2021 2137   AST 18 05/02/2014 1523   ALT 21 01/04/2021 2137   ALT 19 05/02/2014 1523   ALKPHOS 64 01/04/2021 2137   ALKPHOS 67 05/02/2014 1523   BILITOT 0.6 01/04/2021 2137   BILITOT 0.8 05/02/2014 1523   GFRNONAA >60 01/04/2021 2137   GFRNONAA >60 05/02/2014 1523   GFRAA >60 11/03/2017 0223   GFRAA >60 05/02/2014 1523   No results found for: "CHOL", "HDL", "LDLCALC", "LDLDIRECT", "TRIG", "CHOLHDL" No results found for: "HGBA1C" No results found for: "VITAMINB12" Lab Results  Component Value Date   TSH 0.777 08/05/2021     ASSESSMENT AND PLAN 52 y.o. year old female  has a past medical history of Anemia, Anxiety, Atrial fibrillation (HCC), Atrial flutter (HCC), HTN (hypertension), diverticulitis of colon, Hypoglycemia, and Obstructive sleep apnea on CPAP. here with     ICD-10-CM   1. OSA on CPAP  G47.33 For home use only DME continuous positive airway pressure (CPAP)       Amanda Holt is doing well on CPAP therapy. Compliance report reveals excellent compliance. She was encouraged to continue using CPAP nightly and for greater than 4 hours each night. She will continue to monitor for leak. I have  encouraged her to replace mask regularly. Risks of untreated sleep apnea review and education materials provided. Healthy lifestyle habits encouraged. She will follow up in 1 year, sooner if needed. She verbalizes understanding and agreement with this plan.    Orders Placed This Encounter  Procedures   For home use only DME continuous positive airway pressure (CPAP)    Supplies    Order Specific Question:   Length  of Need    Answer:   Lifetime    Order Specific Question:   Patient has OSA or probable OSA    Answer:   Yes    Order Specific Question:   Is the patient currently using CPAP in the home    Answer:   Yes    Order Specific Question:   Settings    Answer:   Other see comments    Order Specific Question:   CPAP supplies needed    Answer:   Mask, headgear, cushions, filters, heated tubing and water chamber     No orders of the defined types were placed in this encounter.     Shawnie Dapper, FNP-C 08/08/2022, 3:18 PM [facility] [address], [address] [place], [place] [address] [phone]

## 2022-08-08 NOTE — Patient Instructions (Signed)

## 2022-08-11 DIAGNOSIS — G4733 Obstructive sleep apnea (adult) (pediatric): Secondary | ICD-10-CM | POA: Diagnosis not present

## 2022-08-11 DIAGNOSIS — R194 Change in bowel habit: Secondary | ICD-10-CM | POA: Diagnosis not present

## 2022-08-11 DIAGNOSIS — K219 Gastro-esophageal reflux disease without esophagitis: Secondary | ICD-10-CM | POA: Diagnosis not present

## 2022-08-11 DIAGNOSIS — K625 Hemorrhage of anus and rectum: Secondary | ICD-10-CM | POA: Diagnosis not present

## 2022-08-11 DIAGNOSIS — Z1211 Encounter for screening for malignant neoplasm of colon: Secondary | ICD-10-CM | POA: Diagnosis not present

## 2022-09-22 ENCOUNTER — Other Ambulatory Visit: Payer: Self-pay | Admitting: Gastroenterology

## 2022-10-27 DIAGNOSIS — F331 Major depressive disorder, recurrent, moderate: Secondary | ICD-10-CM | POA: Diagnosis not present

## 2022-10-27 DIAGNOSIS — F411 Generalized anxiety disorder: Secondary | ICD-10-CM | POA: Diagnosis not present

## 2022-10-27 DIAGNOSIS — G4700 Insomnia, unspecified: Secondary | ICD-10-CM | POA: Diagnosis not present

## 2022-12-07 DIAGNOSIS — Z114 Encounter for screening for human immunodeficiency virus [HIV]: Secondary | ICD-10-CM | POA: Diagnosis not present

## 2022-12-07 DIAGNOSIS — Z0000 Encounter for general adult medical examination without abnormal findings: Secondary | ICD-10-CM | POA: Diagnosis not present

## 2022-12-07 DIAGNOSIS — Z13220 Encounter for screening for lipoid disorders: Secondary | ICD-10-CM | POA: Diagnosis not present

## 2022-12-09 ENCOUNTER — Encounter (HOSPITAL_COMMUNITY): Payer: Self-pay | Admitting: Gastroenterology

## 2022-12-09 NOTE — Progress Notes (Signed)
Attempted to obtain medical history for pre op call via telephone, unable to reach at this time. HIPAA compliant voicemail message left requesting return call to pre surgical testing department.

## 2022-12-09 NOTE — Progress Notes (Signed)
Pre op call eval Name:Amanda Louie Bun MD Cardiologist-Hochrein MD  EKG-08/05/21 Echo-08/16/21 Cath-n/a Stress-n/a ICD/PM-n/a Blood thinner- n/a GLP-1- n/a  Hx:HTN, AFib/AFlutter, OSA. Last saw cardiology 10/2021 for SOB , they recommended PFTs be done. The pulm function test was completed on 10/26/2021. I asked pt if she is to see cardiology yearly and she said yes but hasnt made appt this year yet. She denied any chest pains but said her SOB is kind of her normal now, but no worse than has been, comes and goes. I asked if she saw a pulm MD and she said no just Neuro for OSA. Anesthesia Review: Yes

## 2022-12-16 ENCOUNTER — Ambulatory Visit (HOSPITAL_COMMUNITY): Payer: BC Managed Care – PPO | Admitting: Registered Nurse

## 2022-12-16 ENCOUNTER — Ambulatory Visit (HOSPITAL_COMMUNITY)
Admission: RE | Admit: 2022-12-16 | Discharge: 2022-12-16 | Disposition: A | Discharge status: Home / Self Care | Payer: BC Managed Care – PPO | Attending: Gastroenterology | Admitting: Gastroenterology

## 2022-12-16 ENCOUNTER — Encounter (HOSPITAL_COMMUNITY)
Admission: RE | Disposition: A | Discharge status: Home / Self Care | Payer: Self-pay | Source: Home / Self Care | Attending: Gastroenterology

## 2022-12-16 ENCOUNTER — Other Ambulatory Visit: Payer: Self-pay

## 2022-12-16 DIAGNOSIS — R197 Diarrhea, unspecified: Secondary | ICD-10-CM | POA: Insufficient documentation

## 2022-12-16 DIAGNOSIS — Z79899 Other long term (current) drug therapy: Secondary | ICD-10-CM | POA: Diagnosis not present

## 2022-12-16 DIAGNOSIS — I10 Essential (primary) hypertension: Secondary | ICD-10-CM | POA: Insufficient documentation

## 2022-12-16 DIAGNOSIS — Z860100 Personal history of colon polyps, unspecified: Secondary | ICD-10-CM | POA: Diagnosis not present

## 2022-12-16 DIAGNOSIS — G4733 Obstructive sleep apnea (adult) (pediatric): Secondary | ICD-10-CM | POA: Insufficient documentation

## 2022-12-16 DIAGNOSIS — K5730 Diverticulosis of large intestine without perforation or abscess without bleeding: Secondary | ICD-10-CM | POA: Diagnosis not present

## 2022-12-16 DIAGNOSIS — Z1211 Encounter for screening for malignant neoplasm of colon: Secondary | ICD-10-CM | POA: Diagnosis not present

## 2022-12-16 DIAGNOSIS — Z87891 Personal history of nicotine dependence: Secondary | ICD-10-CM | POA: Diagnosis not present

## 2022-12-16 DIAGNOSIS — E119 Type 2 diabetes mellitus without complications: Secondary | ICD-10-CM | POA: Diagnosis not present

## 2022-12-16 HISTORY — PX: BIOPSY: SHX5522

## 2022-12-16 HISTORY — PX: COLONOSCOPY WITH PROPOFOL: SHX5780

## 2022-12-16 SURGERY — COLONOSCOPY WITH PROPOFOL
Anesthesia: Monitor Anesthesia Care

## 2022-12-16 MED ORDER — SODIUM CHLORIDE 0.9 % IV SOLN: INTRAVENOUS | Status: DC

## 2022-12-16 MED ORDER — PROPOFOL 500 MG/50ML IV EMUL
INTRAVENOUS | Status: DC | PRN
  Administered 2022-12-16: 140 ug/kg/min via INTRAVENOUS

## 2022-12-16 MED ORDER — PROPOFOL 10 MG/ML IV BOLUS
INTRAVENOUS | Status: DC | PRN
  Administered 2022-12-16: 100 mg via INTRAVENOUS
  Administered 2022-12-16: 30 mg via INTRAVENOUS

## 2022-12-16 SURGICAL SUPPLY — 22 items

## 2022-12-16 NOTE — H&P (Signed)
Amanda Holt HPI: This 52 year old white female presents to the office for colorectal cancer screening. She has 1-2-3 BM's per day with no occasional bright red blood in the stool. About once a week she has the problems with diarrhea and has several loose BM's all day. She denies using artificial sweeteners as they cause her to have migraine headaches. She has a good appetite and her weight has been stable. She has had problems with reflux frequently over the last year.  She denies having any complaints of abdominal pain, nausea, vomiting, dysphagia or odynophagia. She denies having a family history of colon cancer, celiac sprue or IBD.  She has had a problem with diverticulitis in the past.  She had a colonoscopy done at [facility] on 10/10/2014 when multiple small mouth diverticula were noted in the rectosigmoid and sigmoid colon and a 4 mm polyp was removed from the sigmoid colon. I do not have the pathology results on that polyp.  Past Medical History:  Diagnosis Date   Anemia    Anxiety    Atrial fibrillation (HCC)    Ablation   Atrial flutter (HCC)    HTN (hypertension)    Hx of diverticulitis of colon    Hypoglycemia    Obstructive sleep apnea on CPAP     Past Surgical History:  Procedure Laterality Date   APPENDECTOMY     cesarian section      Family History  Problem Relation Age of Onset   Cancer Mother        Gallbladder   Breast cancer Paternal Grandmother 41   Breast cancer Paternal Aunt 46    Social History:  reports that she has quit smoking. She has never used smokeless tobacco. She reports current alcohol use. She reports that she does not use drugs.  Allergies:  Allergies  Allergen Reactions   Barbiturates Nausea And Vomiting and Other (See Comments)    Patient states this medication makes her pass out.   Codeine Nausea And Vomiting    Medications: Scheduled: Continuous:  sodium chloride      No results found for this or any previous visit (from the  past 24 hour(s)).   No results found.  ROS:  As stated above in the HPI otherwise negative.  Weight 103.4 kg.    PE: Gen: NAD, Alert and Oriented HEENT:  [place]/AT, EOMI Neck: Supple, no LAD Lungs: CTA Bilaterally CV: RRR without M/G/R ABD: Soft, NTND, +BS Ext: No C/C/E  Assessment/Plan: 1) Screening - colonoscopy. 2) Diarrhea.  Aziza Stuckert D 12/16/2022, 7:03 AM

## 2022-12-16 NOTE — Anesthesia Preprocedure Evaluation (Signed)
Anesthesia Evaluation  Patient identified by MRN, date of birth, ID band Patient awake    Reviewed: Allergy & Precautions, H&P , NPO status , Patient's Chart, lab work & pertinent test results  Airway Mallampati: II  TM Distance: >3 FB Neck ROM: Full    Dental no notable dental hx.    Pulmonary sleep apnea , former smoker   Pulmonary exam normal breath sounds clear to auscultation       Cardiovascular hypertension, Pt. on medications negative cardio ROS Normal cardiovascular exam Rhythm:Regular Rate:Normal     Neuro/Psych   Anxiety     negative neurological ROS  negative psych ROS   GI/Hepatic negative GI ROS, Neg liver ROS,,,  Endo/Other  diabetes    Renal/GU negative Renal ROS  negative genitourinary   Musculoskeletal negative musculoskeletal ROS (+)    Abdominal  (+) + obese  Peds negative pediatric ROS (+)  Hematology negative hematology ROS (+)   Anesthesia Other Findings   Reproductive/Obstetrics negative OB ROS                             Anesthesia Physical Anesthesia Plan  ASA: 3  Anesthesia Plan: MAC   Post-op Pain Management: Minimal or no pain anticipated   Induction: Intravenous  PONV Risk Score and Plan: 2 and Propofol infusion and Treatment may vary due to age or medical condition  Airway Management Planned: Simple Face Mask  Additional Equipment:   Intra-op Plan:   Post-operative Plan:   Informed Consent: I have reviewed the patients History and Physical, chart, labs and discussed the procedure including the risks, benefits and alternatives for the proposed anesthesia with the patient or authorized representative who has indicated his/her understanding and acceptance.     Dental advisory given  Plan Discussed with: CRNA  Anesthesia Plan Comments:        Anesthesia Quick Evaluation

## 2022-12-16 NOTE — Anesthesia Postprocedure Evaluation (Signed)
Anesthesia Post Note  Patient: Amanda Holt  Procedure(s) Performed: COLONOSCOPY WITH PROPOFOL BIOPSY     Patient location during evaluation: PACU Anesthesia Type: MAC Level of consciousness: awake and alert Pain management: pain level controlled Vital Signs Assessment: post-procedure vital signs reviewed and stable Respiratory status: spontaneous breathing, nonlabored ventilation and respiratory function stable Cardiovascular status: blood pressure returned to baseline and stable Postop Assessment: no apparent nausea or vomiting Anesthetic complications: no   No notable events documented.  Last Vitals:  Vitals:   12/16/22 0810 12/16/22 0820  BP: 130/82 132/80  Pulse: 76 65  Resp: 20 14  Temp:    SpO2: 99% 100%    Last Pain:  Vitals:   12/16/22 0820  TempSrc:   PainSc: 0-No pain                 Lowella Curb

## 2022-12-16 NOTE — Discharge Instructions (Signed)
YOU HAD AN ENDOSCOPIC PROCEDURE TODAY: Refer to the procedure report and other information in the discharge instructions given to you for any specific questions about what was found during the examination. If this information does not answer your questions, please call [place] office at [phone] to clarify.   YOU SHOULD EXPECT: Some feelings of bloating in the abdomen. Passage of more gas than usual. Walking can help get rid of the air that was put into your GI tract during the procedure and reduce the bloating. If you had a lower endoscopy (such as a colonoscopy or flexible sigmoidoscopy) you may notice spotting of blood in your stool or on the toilet paper. Some abdominal soreness may be present for a day or two, also.  DIET: Your first meal following the procedure should be a light meal and then it is ok to progress to your normal diet. A half-sandwich or bowl of soup is an example of a good first meal. Heavy or fried foods are harder to digest and may make you feel nauseous or bloated. Drink plenty of fluids but you should avoid alcoholic beverages for 24 hours.  ACTIVITY: Your care partner should take you home directly after the procedure. You should plan to take it easy, moving slowly for the rest of the day. You can resume normal activity the day after the procedure however YOU SHOULD NOT DRIVE, use power tools, machinery or perform tasks that involve climbing or major physical exertion for 24 hours (because of the sedation medicines used during the test).   SYMPTOMS TO REPORT IMMEDIATELY: A gastroenterologist can be reached at any hour. Please call [phone]  for any of the following symptoms:  Following lower endoscopy (colonoscopy, flexible sigmoidoscopy) Excessive amounts of blood in the stool  Significant tenderness, worsening of abdominal pains  Swelling of the abdomen that is new, acute  Fever of 100 or higher  FOLLOW UP:  If any biopsies were taken you will be contacted by  phone or by letter within the next 1-3 weeks. Call [phone]  if you have not heard about the biopsies in 3 weeks.  Please also call with any specific questions about appointments or follow up tests.

## 2022-12-16 NOTE — Transfer of Care (Signed)
Immediate Anesthesia Transfer of Care Note  Patient: Amanda Holt  Procedure(s) Performed: COLONOSCOPY WITH PROPOFOL BIOPSY  Patient Location: PACU  Anesthesia Type:General  Level of Consciousness: awake, alert , and oriented  Airway & Oxygen Therapy: Patient Spontanous Breathing  Post-op Assessment: Report given to RN and Post -op Vital signs reviewed and stable  Post vital signs: Reviewed and stable  Last Vitals:  Vitals Value Taken Time  BP    Temp    Pulse 80 12/16/22 0759  Resp 16 12/16/22 0759  SpO2 99 % 12/16/22 0759  Vitals shown include unfiled device data.  Last Pain:  Vitals:   12/16/22 0710  TempSrc: Temporal  PainSc: 2          Complications: No notable events documented.

## 2022-12-16 NOTE — Op Note (Signed)
[facility] Patient Name: Amanda Holt Procedure Date: 12/16/2022 MRN: [id number] Attending MD: Jeani Hawking , MD, [id number] Date of Birth: 08/02/1970 CSN: [id number] Age: 52 Admit Type: Outpatient Procedure:                Colonoscopy Indications:              Screening for colorectal malignant neoplasm Providers:                Jeani Hawking, MD, Suzy Bouchard, RN, Fransisca Connors, Geoffery Lyons, Technician, Kandice Robinsons, Technician Referring MD:              Medicines:                 Complications:            No immediate complications. Estimated Blood Loss:     Estimated blood loss: none. Procedure:                Pre-Anesthesia Assessment:                           - Prior to the procedure, a History and Physical                            was performed, and patient medications and                            allergies were reviewed. The patient's tolerance of                            previous anesthesia was also reviewed. The risks                            and benefits of the procedure and the sedation                            options and risks were discussed with the patient.                            All questions were answered, and informed consent                            was obtained. Prior Anticoagulants: The patient has                            taken no anticoagulant or antiplatelet agents. ASA                            Grade Assessment: II - A patient with mild systemic                            disease. After  reviewing the risks and benefits,                            the patient was deemed in satisfactory condition to                            undergo the procedure.                           - Sedation was administered by an anesthesia                            professional. Deep sedation was attained.                           After obtaining informed consent, the colonoscope                             was passed under direct vision. Throughout the                            procedure, the patient's blood pressure, pulse, and                            oxygen saturations were monitored continuously. The                            CF-HQ190L ([id number]) Olympus colonoscope was                            introduced through the anus and advanced to the the                            cecum, identified by appendiceal orifice and                            ileocecal valve. The colonoscopy was performed with                            moderate difficulty due to significant looping.                            Successful completion of the procedure was aided by                            using manual pressure and straightening and                            shortening the scope to obtain bowel loop                            reduction. The patient tolerated the procedure                            well.  The quality of the bowel preparation was                            evaluated using the BBPS [facility] Bowel Preparation                            Scale) with scores of: Right Colon = 3, Transverse                            Colon = 3 and Left Colon = 3 (entire mucosa seen                            well with no residual staining, small fragments of                            stool or opaque liquid). The total BBPS score                            equals 9. The ileocecal valve, appendiceal orifice,                            and rectum were photographed. Scope In: 7:31:25 AM Scope Out: 7:53:25 AM Scope Withdrawal Time: 0 hours 10 minutes 2 seconds  Total Procedure Duration: 0 hours 22 minutes 0 seconds  Findings:      Scattered large-mouthed, medium-mouthed and small-mouthed diverticula       were found in the sigmoid colon.      Normal mucosa was found in the entire colon. Biopsies for histology were       taken with a cold forceps from the ascending colon and descending  colon       for evaluation of microscopic colitis. Impression:               - Diverticulosis in the sigmoid colon.                           - Normal mucosa in the entire examined colon.                            Biopsied. Moderate Sedation:      Not Applicable - Patient had care per Anesthesia. Recommendation:           - Patient has a contact number available for                            emergencies. The signs and symptoms of potential                            delayed complications were discussed with the                            patient. Return to normal activities tomorrow.                            Written discharge instructions were provided to the  patient.                           - Resume previous diet.                           - Continue present medications.                           - Await pathology results.                           - Repeat colonoscopy in 10 years for screening                            purposes. Procedure Code(s):        --- Professional ---                           [id number], Colonoscopy, flexible; with biopsy, single                            or multiple Diagnosis Code(s):        --- Professional ---                           Z12.11, Encounter for screening for malignant                            neoplasm of colon                           K57.30, Diverticulosis of large intestine without                            perforation or abscess without bleeding CPT copyright 2022 American Medical Association. All rights reserved. The codes documented in this report are preliminary and upon coder review may  be revised to meet current compliance requirements. Jeani Hawking, MD Jeani Hawking, MD 12/16/2022 7:59:58 AM This report has been signed electronically. Number of Addenda: 0

## 2022-12-19 ENCOUNTER — Encounter (HOSPITAL_COMMUNITY): Payer: Self-pay | Admitting: Gastroenterology

## 2022-12-19 DIAGNOSIS — Z0000 Encounter for general adult medical examination without abnormal findings: Secondary | ICD-10-CM | POA: Diagnosis not present

## 2022-12-19 LAB — SURGICAL PATHOLOGY

## 2022-12-20 ENCOUNTER — Other Ambulatory Visit: Payer: Self-pay | Admitting: Nurse Practitioner

## 2022-12-20 DIAGNOSIS — Z1231 Encounter for screening mammogram for malignant neoplasm of breast: Secondary | ICD-10-CM

## 2022-12-20 DIAGNOSIS — G4733 Obstructive sleep apnea (adult) (pediatric): Secondary | ICD-10-CM | POA: Diagnosis not present

## 2023-02-08 ENCOUNTER — Encounter: Payer: Self-pay | Admitting: Podiatry

## 2023-02-08 ENCOUNTER — Ambulatory Visit: Payer: BC Managed Care – PPO | Admitting: Podiatry

## 2023-02-08 DIAGNOSIS — L600 Ingrowing nail: Secondary | ICD-10-CM

## 2023-02-08 DIAGNOSIS — L03032 Cellulitis of left toe: Secondary | ICD-10-CM

## 2023-02-08 NOTE — Patient Instructions (Signed)

## 2023-02-12 NOTE — Progress Notes (Signed)
 Subjective:   Patient ID: Amanda Holt, female   DOB: 53 y.o.   MRN: [id number]   HPI Patient states she is developed a lot of inflammation at the base of her left big toe and it has been red and moderately swollen and sore   ROS      Objective:  Physical Exam  Neurovascular status intact with inflammation at the base of the hallux lateral side localized.  No proximal edema erythema or drainage was noted     Assessment:  Paronychia infection left hallux lateral border     Plan:  H&P reviewed condition and went ahead today anesthetized the toe and using sterile instruments after sterile prep I did carefully remove a small amount of lateral border and then I open the area up and I was able to flush it with no active drainage noted.  I applied sterile dressing instructed on soaks and reappoint as symptoms indicate

## 2023-02-15 DIAGNOSIS — J019 Acute sinusitis, unspecified: Secondary | ICD-10-CM | POA: Diagnosis not present

## 2023-02-15 DIAGNOSIS — Z1159 Encounter for screening for other viral diseases: Secondary | ICD-10-CM | POA: Diagnosis not present

## 2023-02-15 DIAGNOSIS — J069 Acute upper respiratory infection, unspecified: Secondary | ICD-10-CM | POA: Diagnosis not present

## 2023-02-20 DIAGNOSIS — J40 Bronchitis, not specified as acute or chronic: Secondary | ICD-10-CM | POA: Diagnosis not present

## 2023-02-20 DIAGNOSIS — J069 Acute upper respiratory infection, unspecified: Secondary | ICD-10-CM | POA: Diagnosis not present

## 2023-03-27 DIAGNOSIS — G4733 Obstructive sleep apnea (adult) (pediatric): Secondary | ICD-10-CM | POA: Diagnosis not present

## 2023-04-05 DIAGNOSIS — G4700 Insomnia, unspecified: Secondary | ICD-10-CM | POA: Diagnosis not present

## 2023-04-05 DIAGNOSIS — I10 Essential (primary) hypertension: Secondary | ICD-10-CM | POA: Diagnosis not present

## 2023-04-05 DIAGNOSIS — K219 Gastro-esophageal reflux disease without esophagitis: Secondary | ICD-10-CM | POA: Diagnosis not present

## 2023-04-05 DIAGNOSIS — F411 Generalized anxiety disorder: Secondary | ICD-10-CM | POA: Diagnosis not present

## 2023-04-17 DIAGNOSIS — R1011 Right upper quadrant pain: Secondary | ICD-10-CM | POA: Diagnosis not present

## 2023-05-08 DIAGNOSIS — E559 Vitamin D deficiency, unspecified: Secondary | ICD-10-CM | POA: Diagnosis not present

## 2023-05-08 DIAGNOSIS — R7303 Prediabetes: Secondary | ICD-10-CM | POA: Diagnosis not present

## 2023-05-12 DIAGNOSIS — I10 Essential (primary) hypertension: Secondary | ICD-10-CM | POA: Diagnosis not present

## 2023-05-12 DIAGNOSIS — E559 Vitamin D deficiency, unspecified: Secondary | ICD-10-CM | POA: Diagnosis not present

## 2023-05-12 DIAGNOSIS — E669 Obesity, unspecified: Secondary | ICD-10-CM | POA: Diagnosis not present

## 2023-05-12 DIAGNOSIS — R7303 Prediabetes: Secondary | ICD-10-CM | POA: Diagnosis not present

## 2023-06-20 DIAGNOSIS — M940 Chondrocostal junction syndrome [Tietze]: Secondary | ICD-10-CM | POA: Diagnosis not present

## 2023-06-20 DIAGNOSIS — M25811 Other specified joint disorders, right shoulder: Secondary | ICD-10-CM | POA: Diagnosis not present

## 2023-06-20 DIAGNOSIS — M545 Low back pain, unspecified: Secondary | ICD-10-CM | POA: Diagnosis not present

## 2023-07-04 DIAGNOSIS — J329 Chronic sinusitis, unspecified: Secondary | ICD-10-CM | POA: Diagnosis not present

## 2023-07-04 DIAGNOSIS — M545 Low back pain, unspecified: Secondary | ICD-10-CM | POA: Diagnosis not present

## 2023-07-04 DIAGNOSIS — B9689 Other specified bacterial agents as the cause of diseases classified elsewhere: Secondary | ICD-10-CM | POA: Diagnosis not present

## 2023-07-04 DIAGNOSIS — J069 Acute upper respiratory infection, unspecified: Secondary | ICD-10-CM | POA: Diagnosis not present

## 2023-07-06 DIAGNOSIS — G4733 Obstructive sleep apnea (adult) (pediatric): Secondary | ICD-10-CM | POA: Diagnosis not present

## 2023-08-07 ENCOUNTER — Telehealth: Payer: Self-pay

## 2023-08-07 NOTE — Telephone Encounter (Signed)
 Please see MyChart message from today 08/07/2023

## 2023-08-07 NOTE — Progress Notes (Unsigned)
 PATIENT: Amanda Holt DOB: [birth year]  REASON FOR VISIT: follow up HISTORY FROM: patient  Virtual Visit via MyChart video  I connected with Amanda Holt on 08/08/23 at  9:30 AM EDT via MyChart video and verified that I am speaking with the correct person using two identifiers.   I discussed the limitations, risks, security and privacy concerns of performing an evaluation and management service by Mychart video and the availability of in person appointments. I also discussed with the patient that there may be a patient responsible charge related to this service. The patient expressed understanding and agreed to proceed.   History of Present Illness:  08/08/23 ALL (MyChart): Amanda Holt is a 53 y.o. female here today for follow up for OSA on CPAP. She continues to do well on therapy. She is using CPAP nightly for about 7 hours, on average. She denies concerns with machine or mask. She is having perimenopausal symptoms followed by PCP. No concerns, today.     08/08/22 ALL:  [name] returns for follow up for OSA on CPAP. She was last seen 07/2021 by Dr Dohmeier after receiving her new CPAP machine. She continues to do well. She is using therapy every night for about 7-8 hours. She denies concerns with machine or supplies. Using nasal pillows. She admits to not replacing as frequently as recommended. She is not bothered by leak. She continues to have insomnia but not interested in sleep aids.     04/05/2021 ALL: Amanda Holt is a 53 y.o. female here today for follow up for OSA on CPAP. She was seen in consult with Dr Chalice 12/2020. She was started on trazodone  25mg  QHS and HST ordered as she would be eligible for new CPAP 03/2021. HST has not been completed. She requests new DME once HST performed and CPAP ordered. Currently using [facility].   She usually goes to bed around 8pm. Usually asleep around 8:30. She wakes around 4-5am. She reports waking up multiple times at night due to dreaming.  She did not start trazodone  for fear of side effects. She has not tried melatonin or valerian root.     HISTORY: (copied from Dr Dohmeier's previous note)  Amanda Holt is a 53 y.o. -Caucasian female patient was seen here in a sleep consultation  on 12/18/2020 from PCP .  Chief concern according to patient :   Pt has a soon 51 year old CPAP. She had a SS in [place] after being dx with atrial fib. About 2012, and moved here in Jan 22, her refills have expired from previous provider and she is needing to establish locally.  Set up date of [facility] 04/08/16, she had a last sleep study HST- before the new machine in 2017.    Amanda Holt  has a past medical history of Anemia,  atrial fibrillation, Atrial flutter (HCC), SVT, ablation, diverticulitis of colon, OSA and on CPAP for a decade, and prediabetes, Hypoglycemia.    CPAP dependent.  Loud snoring. Severe apnea.    Sleep relevant medical history: Nocturia  before CPAP, still 2 a night, sleep walker in her youth,  no ENT, no thyroid  disease.   Family medical /sleep history: cousins  on CPAP with OSA, sons with sleep walking.    Social history: patient is in school.  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   The patient currently  works in an office- 6 AM - 3 Pm, works from home .   Tobacco use- none , quit 15 years ago. Smoked since age 53.   ETOH use - rare ,  Caffeine intake only decaffeinated.  Regular exercise in form of 3-4 times a week- home. .   Hobbies : writing, painting, making soaps.     Sleep habits are as follows: The patient's dinner time is between 4.30 - 6.30 PM. The patient goes to bed at 8 PM and continues to sleep for 6 hours of interrupted sleep, wakes for other than  bathroom breaks, the first time at 12 AM.   The preferred sleep position is left side, with the support of 2 pillows.  Dreams are reportedly frequent/vivid- 3-5 dreams a  night.SABRA  4-5  AM is the usual rise time. The patient wakes up spontaneously at 3.30 AM, when husband leaves for work..  She reports not feeling refreshed or restored in AM, with symptoms such as dry mouth, morning headaches, and residual fatigue.  Naps are taken infrequently, she fights naps- feels worse- lasting from 60-120  minutes and are less refreshing than nocturnal sleep.    Observations/Objective:  Generalized: Well developed, in no acute distress  Mentation: Alert oriented to time, place, history taking. Follows all commands speech and language fluent   Assessment and Plan:  53 y.o. year old female  has a past medical history of Anemia, Anxiety, Atrial fibrillation (HCC), Atrial flutter (HCC), HTN (hypertension), diverticulitis of colon, Hypoglycemia, and Obstructive sleep apnea on CPAP. here with    ICD-10-CM   1. OSA on CPAP  G47.33 For home use only DME continuous positive airway pressure (CPAP)      Amanda Holt continues to do well on therapy. Compliance report shows excellent compliance. She was encouraged to continue using therapy nightly for at least 4 hours. Supply orders updated. Healthy lifestyle habits encouraged. She will follow up with me in 1 year, sooner if needed.   Orders Placed This Encounter  Procedures   For home use only DME continuous positive airway pressure (CPAP)    Heated Humidity with all supplies as needed    Length of Need:   Lifetime    Patient has OSA or probable OSA:   Yes    Is the patient currently using CPAP in the home:   Yes    Settings:   Other see comments    CPAP supplies needed:   Mask, headgear, cushions, filters, heated tubing and water chamber    No orders of the defined types were placed in this encounter.    Follow Up Instructions:  I discussed the assessment and treatment plan with the patient. The patient was provided an opportunity to ask questions and all were answered. The patient agreed with the plan and demonstrated an  understanding of the instructions.   The patient was advised to call back or seek an in-person evaluation if the symptoms worsen or if the condition fails to improve as anticipated.  I provided 15 minutes of face-to-face and non face-to-face time during this MyChart video encounter. Patient located at their place of residence. Provider is in the office.    Siyon Linck, NP

## 2023-08-07 NOTE — Progress Notes (Unsigned)
 SABRA

## 2023-08-07 NOTE — Patient Instructions (Signed)

## 2023-08-08 ENCOUNTER — Encounter: Payer: Self-pay | Admitting: Family Medicine

## 2023-08-08 ENCOUNTER — Telehealth (INDEPENDENT_AMBULATORY_CARE_PROVIDER_SITE_OTHER): Payer: BC Managed Care – PPO | Admitting: Family Medicine

## 2023-08-08 DIAGNOSIS — G4733 Obstructive sleep apnea (adult) (pediatric): Secondary | ICD-10-CM

## 2023-08-08 NOTE — Progress Notes (Signed)
   Signed off on order in goscripts as well.

## 2023-08-08 NOTE — Progress Notes (Signed)
 Community message sent to [facility] that order placed for CPAP supplies.

## 2023-09-04 DIAGNOSIS — R739 Hyperglycemia, unspecified: Secondary | ICD-10-CM | POA: Diagnosis not present

## 2023-09-04 DIAGNOSIS — R5383 Other fatigue: Secondary | ICD-10-CM | POA: Diagnosis not present

## 2023-09-04 DIAGNOSIS — E559 Vitamin D deficiency, unspecified: Secondary | ICD-10-CM | POA: Diagnosis not present

## 2023-09-08 DIAGNOSIS — Z6835 Body mass index (BMI) 35.0-35.9, adult: Secondary | ICD-10-CM | POA: Diagnosis not present

## 2023-09-08 DIAGNOSIS — I10 Essential (primary) hypertension: Secondary | ICD-10-CM | POA: Diagnosis not present

## 2023-09-08 DIAGNOSIS — E559 Vitamin D deficiency, unspecified: Secondary | ICD-10-CM | POA: Diagnosis not present

## 2023-09-08 DIAGNOSIS — R7303 Prediabetes: Secondary | ICD-10-CM | POA: Diagnosis not present

## 2023-10-27 DIAGNOSIS — G4733 Obstructive sleep apnea (adult) (pediatric): Secondary | ICD-10-CM | POA: Diagnosis not present

## 2023-12-18 DIAGNOSIS — Z0000 Encounter for general adult medical examination without abnormal findings: Secondary | ICD-10-CM | POA: Diagnosis not present

## 2023-12-18 DIAGNOSIS — Z13220 Encounter for screening for lipoid disorders: Secondary | ICD-10-CM | POA: Diagnosis not present

## 2023-12-18 DIAGNOSIS — R739 Hyperglycemia, unspecified: Secondary | ICD-10-CM | POA: Diagnosis not present

## 2023-12-21 DIAGNOSIS — Z0000 Encounter for general adult medical examination without abnormal findings: Secondary | ICD-10-CM | POA: Diagnosis not present

## 2023-12-22 DIAGNOSIS — F411 Generalized anxiety disorder: Secondary | ICD-10-CM | POA: Diagnosis not present

## 2023-12-22 DIAGNOSIS — I10 Essential (primary) hypertension: Secondary | ICD-10-CM | POA: Diagnosis not present

## 2023-12-22 DIAGNOSIS — G4700 Insomnia, unspecified: Secondary | ICD-10-CM | POA: Diagnosis not present

## 2023-12-22 DIAGNOSIS — R7303 Prediabetes: Secondary | ICD-10-CM | POA: Diagnosis not present

## 2024-08-13 ENCOUNTER — Telehealth: Admitting: Family Medicine
